# Patient Record
Sex: Female | Born: 1950 | Race: White | Hispanic: No | Smoking: Never smoker
Health system: Southern US, Community
[De-identification: ages and names within clinical notes are randomized; demographics above are authoritative.]

## PROBLEM LIST (undated history)

## (undated) DIAGNOSIS — E119 Type 2 diabetes mellitus without complications: Secondary | ICD-10-CM

## (undated) DIAGNOSIS — G709 Myoneural disorder, unspecified: Secondary | ICD-10-CM

## (undated) DIAGNOSIS — M069 Rheumatoid arthritis, unspecified: Secondary | ICD-10-CM

## (undated) DIAGNOSIS — I10 Essential (primary) hypertension: Secondary | ICD-10-CM

## (undated) DIAGNOSIS — T7840XA Allergy, unspecified, initial encounter: Secondary | ICD-10-CM

## (undated) DIAGNOSIS — K219 Gastro-esophageal reflux disease without esophagitis: Secondary | ICD-10-CM

## (undated) DIAGNOSIS — E079 Disorder of thyroid, unspecified: Secondary | ICD-10-CM

## (undated) HISTORY — DX: Gastro-esophageal reflux disease without esophagitis: K21.9

## (undated) HISTORY — DX: Allergy, unspecified, initial encounter: T78.40XA

## (undated) HISTORY — DX: Type 2 diabetes mellitus without complications: E11.9

## (undated) HISTORY — DX: Myoneural disorder, unspecified: G70.9

## (undated) HISTORY — DX: Essential (primary) hypertension: I10

## (undated) HISTORY — DX: Rheumatoid arthritis, unspecified: M06.9

## (undated) HISTORY — DX: Disorder of thyroid, unspecified: E07.9

## (undated) HISTORY — PX: CHOLECYSTECTOMY: SHX55

## (undated) HISTORY — PX: TUBAL LIGATION: SHX77

---

## 1978-03-14 HISTORY — PX: CHOLECYSTECTOMY, LAPAROSCOPIC: SHX56

## 1989-03-14 HISTORY — PX: ABDOMINAL HYSTERECTOMY: SHX81

## 2012-08-26 LAB — HM COLONOSCOPY

## 2019-07-22 LAB — CBC AND DIFFERENTIAL
HCT: 43 (ref 36–46)
Hemoglobin: 14.1 (ref 12.0–16.0)
WBC: 10.6

## 2019-07-22 LAB — CBC: RBC: 4.67 (ref 3.87–5.11)

## 2019-07-23 LAB — MICROALBUMIN, URINE: Microalb, Ur: 0.3

## 2020-01-23 LAB — LIPID PANEL
Cholesterol: 235 — AB (ref 0–200)
HDL: 46 (ref 35–70)
LDL Cholesterol: 147
LDl/HDL Ratio: 5.1
Triglycerides: 271 — AB (ref 40–160)

## 2020-01-23 LAB — BASIC METABOLIC PANEL
BUN: 16 (ref 4–21)
CO2: 26 — AB (ref 13–22)
Chloride: 104 (ref 99–108)
Creatinine: 1.1 (ref 0.5–1.1)
Glucose: 97
Potassium: 5 (ref 3.4–5.3)
Sodium: 139 (ref 137–147)

## 2020-01-23 LAB — COMPREHENSIVE METABOLIC PANEL
Albumin: 4.1 (ref 3.5–5.0)
Calcium: 9.7 (ref 8.7–10.7)
GFR calc Af Amer: 62
GFR calc non Af Amer: 53
Globulin: 3.2

## 2020-01-23 LAB — TSH: TSH: 2.63 (ref 0.41–5.90)

## 2020-01-23 LAB — CBC AND DIFFERENTIAL: Platelets: 390 (ref 150–399)

## 2020-01-23 LAB — HEPATIC FUNCTION PANEL
ALT: 15 (ref 7–35)
AST: 16 (ref 13–35)
Alkaline Phosphatase: 69 (ref 25–125)
Bilirubin, Total: 0.7

## 2020-01-23 LAB — HEMOGLOBIN A1C: Hemoglobin A1C: 6.4

## 2020-04-10 LAB — HM MAMMOGRAPHY

## 2020-09-23 ENCOUNTER — Telehealth: Payer: Self-pay

## 2020-09-23 DIAGNOSIS — E119 Type 2 diabetes mellitus without complications: Secondary | ICD-10-CM | POA: Insufficient documentation

## 2020-09-23 DIAGNOSIS — K219 Gastro-esophageal reflux disease without esophagitis: Secondary | ICD-10-CM | POA: Insufficient documentation

## 2020-09-23 DIAGNOSIS — M069 Rheumatoid arthritis, unspecified: Secondary | ICD-10-CM | POA: Insufficient documentation

## 2020-09-23 DIAGNOSIS — M170 Bilateral primary osteoarthritis of knee: Secondary | ICD-10-CM | POA: Insufficient documentation

## 2020-09-23 DIAGNOSIS — E118 Type 2 diabetes mellitus with unspecified complications: Secondary | ICD-10-CM | POA: Insufficient documentation

## 2020-09-23 DIAGNOSIS — I10 Essential (primary) hypertension: Secondary | ICD-10-CM | POA: Insufficient documentation

## 2020-09-23 NOTE — Telephone Encounter (Signed)
Precharted for patients new patient visit coming up with Dr Bari Edward on 09/30/20.

## 2020-09-30 ENCOUNTER — Other Ambulatory Visit: Payer: Self-pay | Admitting: Internal Medicine

## 2020-09-30 ENCOUNTER — Other Ambulatory Visit: Payer: Self-pay

## 2020-09-30 ENCOUNTER — Ambulatory Visit (INDEPENDENT_AMBULATORY_CARE_PROVIDER_SITE_OTHER): Payer: Medicare PPO | Admitting: Internal Medicine

## 2020-09-30 ENCOUNTER — Encounter: Payer: Self-pay | Admitting: Internal Medicine

## 2020-09-30 VITALS — BP 122/62 | HR 96 | Resp 14 | Ht 62.0 in | Wt 204.0 lb

## 2020-09-30 DIAGNOSIS — E1129 Type 2 diabetes mellitus with other diabetic kidney complication: Secondary | ICD-10-CM

## 2020-09-30 DIAGNOSIS — R809 Proteinuria, unspecified: Secondary | ICD-10-CM

## 2020-09-30 DIAGNOSIS — M170 Bilateral primary osteoarthritis of knee: Secondary | ICD-10-CM

## 2020-09-30 DIAGNOSIS — I10 Essential (primary) hypertension: Secondary | ICD-10-CM | POA: Diagnosis not present

## 2020-09-30 DIAGNOSIS — E039 Hypothyroidism, unspecified: Secondary | ICD-10-CM | POA: Insufficient documentation

## 2020-09-30 DIAGNOSIS — J3089 Other allergic rhinitis: Secondary | ICD-10-CM | POA: Diagnosis not present

## 2020-09-30 DIAGNOSIS — E790 Hyperuricemia without signs of inflammatory arthritis and tophaceous disease: Secondary | ICD-10-CM

## 2020-09-30 DIAGNOSIS — K219 Gastro-esophageal reflux disease without esophagitis: Secondary | ICD-10-CM

## 2020-09-30 DIAGNOSIS — E118 Type 2 diabetes mellitus with unspecified complications: Secondary | ICD-10-CM | POA: Diagnosis not present

## 2020-09-30 LAB — POCT UA - MICROALBUMIN: Microalbumin Ur, POC: 20 mg/L

## 2020-09-30 MED ORDER — CELECOXIB 200 MG PO CAPS: 200.0000 mg | ORAL_CAPSULE | Freq: Every day | ORAL | 1 refills | Status: DC

## 2020-09-30 MED ORDER — METFORMIN HCL 500 MG PO TABS: 500.0000 mg | ORAL_TABLET | Freq: Two times a day (BID) | ORAL | 1 refills | Status: DC

## 2020-09-30 MED ORDER — LEVOTHYROXINE SODIUM 50 MCG PO TABS: 50.0000 ug | ORAL_TABLET | Freq: Every day | ORAL | 1 refills | Status: DC

## 2020-09-30 MED ORDER — OMEPRAZOLE 40 MG PO CPDR: 40.0000 mg | DELAYED_RELEASE_CAPSULE | Freq: Every day | ORAL | 1 refills | Status: DC

## 2020-09-30 MED ORDER — LISINOPRIL 10 MG PO TABS: 10.0000 mg | ORAL_TABLET | Freq: Every day | ORAL | 1 refills | Status: DC

## 2020-09-30 MED ORDER — ALLOPURINOL 100 MG PO TABS: 100.0000 mg | ORAL_TABLET | Freq: Every day | ORAL | 1 refills | Status: DC

## 2020-09-30 NOTE — Progress Notes (Signed)
Date:  09/30/2020   Name:  Alexandria Franklin   DOB:  12-23-1950   MRN:  [id number]   Chief Complaint: Establish Care, Gastroesophageal Reflux (History of abnormal EGD/Colonoscopy around 2015; unsure if hernia or aneurism; taking omeprazole 40 mg daily), and Sinus Problem (Right-sided facial temporal and eye pressure; taking Afrin as needed with some relief)  Gastroesophageal Reflux She complains of belching, coughing, heartburn and a hoarse voice. She reports no abdominal pain or no chest pain. This is a recurrent problem. The problem occurs occasionally. Pertinent negatives include no fatigue. She has tried a PPI for the symptoms.  Sinus Problem This is a recurrent problem. The problem has been waxing and waning since onset. There has been no fever. The pain is mild. Associated symptoms include coughing, ear pain, a hoarse voice and sinus pressure. Pertinent negatives include no headaches or shortness of breath. Treatments tried: Afrin and Flonase. The treatment provided no relief.  Diabetes She presents for her follow-up diabetic visit. She has type 2 diabetes mellitus. The initial diagnosis of diabetes was made 8 years ago. Her disease course has been stable. Pertinent negatives for hypoglycemia include no headaches, nervousness/anxiousness or tremors. Pertinent negatives for diabetes include no chest pain, no fatigue, no polydipsia and no polyuria. Current diabetic treatment includes oral agent (monotherapy). She is compliant with treatment all of the time. Her weight is stable. She is following a generally healthy diet. Blood glucose monitoring compliance is poor. Her breakfast blood glucose is taken between 6-7 am. Her breakfast blood glucose range is generally 110-130 mg/dl. An ACE inhibitor/angiotensin II receptor blocker is being taken.  Hypertension This is a chronic problem. The problem is controlled. Pertinent negatives include no chest pain, headaches, palpitations or shortness of breath.  Risk factors for coronary artery disease include dyslipidemia and diabetes mellitus. Past treatments include ACE inhibitors.   No results found for: CREATININE, BUN, NA, K, CL, CO2 No results found for: CHOL, HDL, LDLCALC, LDLDIRECT, TRIG, CHOLHDL No results found for: TSH No results found for: HGBA1C No results found for: WBC, HGB, HCT, MCV, PLT No results found for: ALT, AST, GGT, ALKPHOS, BILITOT   Review of Systems  Constitutional:  Negative for appetite change, fatigue, fever and unexpected weight change.  HENT:  Positive for ear pain, hoarse voice and sinus pressure. Negative for tinnitus and trouble swallowing.   Eyes:  Negative for visual disturbance.  Respiratory:  Positive for cough. Negative for chest tightness and shortness of breath.   Cardiovascular:  Negative for chest pain, palpitations and leg swelling.  Gastrointestinal:  Positive for heartburn. Negative for abdominal pain.  Endocrine: Negative for polydipsia and polyuria.  Genitourinary:  Negative for dysuria and hematuria.  Musculoskeletal:  Positive for arthralgias (knees).  Skin:  Negative for color change and rash.  Neurological:  Negative for tremors, numbness and headaches.  Psychiatric/Behavioral:  Negative for dysphoric mood and sleep disturbance. The patient is not nervous/anxious.    Patient Active Problem List   Diagnosis Date Noted   Acquired hypothyroidism 09/30/2020   Type II diabetes mellitus with complication (HCC) 09/23/2020   Essential hypertension 09/23/2020   GERD (gastroesophageal reflux disease) 09/23/2020   Rheumatoid arthritis (HCC) 09/23/2020    Allergies  Allergen Reactions   Codeine Nausea Only    Past Surgical History:  Procedure Laterality Date   ABDOMINAL HYSTERECTOMY  1991   CHOLECYSTECTOMY, LAPAROSCOPIC  1980    Social History   Tobacco Use   Smoking status: Never  Passive exposure: Never   Smokeless tobacco: Never  Vaping Use   Vaping Use: Never used  Substance  Use Topics   Alcohol use: Not Currently   Drug use: Never     Medication list has been reviewed and updated.  Current Meds  Medication Sig   allopurinol (ZYLOPRIM) 100 MG tablet Take 100 mg by mouth daily.   celecoxib (CELEBREX) 200 MG capsule Take 200 mg by mouth daily.   levothyroxine (SYNTHROID) 50 MCG tablet Take 50 mcg by mouth daily before breakfast.   lisinopril (ZESTRIL) 10 MG tablet Take 10 mg by mouth daily.   metFORMIN (GLUCOPHAGE) 500 MG tablet Take 500 mg by mouth 2 (two) times daily with a meal.   Multiple Vitamin (MULTIVITAMIN ADULT PO) Take 1 capsule by mouth daily.   omeprazole (PRILOSEC) 40 MG capsule Take 40 mg by mouth daily.   oxymetazoline (AFRIN) 0.05 % nasal spray Place 1 spray into both nostrils 2 (two) times daily as needed for congestion.    PHQ 2/9 Scores 09/30/2020  PHQ - 2 Score 0  PHQ- 9 Score 4    GAD 7 : Generalized Anxiety Score 09/30/2020  Nervous, Anxious, on Edge 0  Control/stop worrying 0  Worry too much - different things 0  Trouble relaxing 0  Restless 0  Easily annoyed or irritable 1  Afraid - awful might happen 0  Total GAD 7 Score 1  Anxiety Difficulty Not difficult at all    BP Readings from Last 3 Encounters:  09/30/20 122/62    Physical Exam Vitals and nursing note reviewed.  Constitutional:      General: She is not in acute distress.    Appearance: Normal appearance. She is well-developed.  HENT:     Head: Normocephalic and atraumatic.  Neck:     Thyroid: No thyroid mass, thyromegaly or thyroid tenderness.     Vascular: No carotid bruit.  Cardiovascular:     Rate and Rhythm: Normal rate and regular rhythm.     Pulses: Normal pulses.     Heart sounds: No murmur heard. Pulmonary:     Effort: Pulmonary effort is normal. No respiratory distress.     Breath sounds: No wheezing or rhonchi.  Musculoskeletal:     Cervical back: Normal range of motion.     Right lower leg: No edema.     Left lower leg: No edema.   Lymphadenopathy:     Cervical: No cervical adenopathy.  Skin:    General: Skin is warm and dry.     Findings: No rash.  Neurological:     Mental Status: She is alert and oriented to person, place, and time.  Psychiatric:        Mood and Affect: Mood normal.        Behavior: Behavior normal.    Wt Readings from Last 3 Encounters:  09/30/20 204 lb (92.5 kg)    BP 122/62 (BP Location: Right Arm, Patient Position: Sitting, Cuff Size: Large)   Pulse 96   Resp 14   Ht 5\' 2"  (1.575 m)   Wt 204 lb (92.5 kg)   SpO2 98%   BMI 37.31 kg/m   Assessment and Plan: 1. Gastroesophageal reflux disease, unspecified whether esophagitis present Symptoms well controlled on daily PPI. Hx of EGD with ? Findings HH per patient - need records to exclude Barrett's No red flag signs such as weight loss, n/v, melena Will continue omeprazole. - CBC with Differential/Platelet - omeprazole (PRILOSEC) 40 MG capsule; Take  1 capsule (40 mg total) by mouth daily.  Dispense: 90 capsule; Refill: 1  2. Essential hypertension Clinically stable exam with well controlled BP. Tolerating medications without side effects at this time. Pt to continue current regimen and low sodium diet; benefits of regular exercise as able discussed. - Comprehensive metabolic panel - lisinopril (ZESTRIL) 10 MG tablet; Take 1 tablet (10 mg total) by mouth daily.  Dispense: 90 tablet; Refill: 1  3. Environmental and seasonal allergies Continue Afrin - recommend trying to wean off Begin Allegra or Claritin daily May need to see ENT  4. Type II diabetes mellitus with complication (HCC) Clinically stable by exam and report without s/s of hypoglycemia. DM complicated by hypertension and dyslipidemia. Tolerating medications well without side effects or other concerns. - Hemoglobin A1c - Lipid panel - POCT UA - Microalbumin +  Needs to continue ACEI - metFORMIN (GLUCOPHAGE) 500 MG tablet; Take 1 tablet (500 mg total) by mouth 2  (two) times daily with a meal.  Dispense: 180 tablet; Refill: 1  5. Acquired hypothyroidism supplemented - levothyroxine (SYNTHROID) 50 MCG tablet; Take 1 tablet (50 mcg total) by mouth daily before breakfast.  Dispense: 90 tablet; Refill: 1 - TSH + free T4  6. Primary osteoarthritis of both knees Continue celebrex - celecoxib (CELEBREX) 200 MG capsule; Take 1 capsule (200 mg total) by mouth daily.  Dispense: 90 capsule; Refill: 1  7. Asymptomatic hyperuricemia On allopurinol with no hx of gouty arthrtis - allopurinol (ZYLOPRIM) 100 MG tablet; Take 1 tablet (100 mg total) by mouth daily.  Dispense: 90 tablet; Refill: 1 - Uric acid   Partially dictated using Animal nutritionist. Any errors are unintentional.  Bari Edward, MD [facility]  09/30/2020

## 2020-09-30 NOTE — Patient Instructions (Signed)
Take Claritin 10 mg or Allegra 180 mg daily for post nasal drip and cough

## 2020-10-01 LAB — CBC WITH DIFFERENTIAL/PLATELET
Basophils Absolute: 0.1 10*3/uL (ref 0.0–0.2)
Basos: 1 %
EOS (ABSOLUTE): 0.4 10*3/uL (ref 0.0–0.4)
Eos: 3 %
Hematocrit: 39.8 % (ref 34.0–46.6)
Hemoglobin: 13.4 g/dL (ref 11.1–15.9)
Immature Grans (Abs): 0 10*3/uL (ref 0.0–0.1)
Immature Granulocytes: 0 %
Lymphocytes Absolute: 4 10*3/uL — ABNORMAL HIGH (ref 0.7–3.1)
Lymphs: 30 %
MCH: 30 pg (ref 26.6–33.0)
MCHC: 33.7 g/dL (ref 31.5–35.7)
MCV: 89 fL (ref 79–97)
Monocytes Absolute: 0.8 10*3/uL (ref 0.1–0.9)
Monocytes: 6 %
Neutrophils Absolute: 8.1 10*3/uL — ABNORMAL HIGH (ref 1.4–7.0)
Neutrophils: 60 %
Platelets: 428 10*3/uL (ref 150–450)
RBC: 4.46 x10E6/uL (ref 3.77–5.28)
RDW: 13.9 % (ref 11.7–15.4)
WBC: 13.4 10*3/uL — ABNORMAL HIGH (ref 3.4–10.8)

## 2020-10-01 LAB — COMPREHENSIVE METABOLIC PANEL
ALT: 10 IU/L (ref 0–32)
AST: 12 IU/L (ref 0–40)
Albumin/Globulin Ratio: 1.9 (ref 1.2–2.2)
Albumin: 4.4 g/dL (ref 3.7–4.7)
Alkaline Phosphatase: 71 IU/L (ref 44–121)
BUN/Creatinine Ratio: 17 (ref 12–28)
BUN: 18 mg/dL (ref 8–27)
Bilirubin Total: 0.3 mg/dL (ref 0.0–1.2)
CO2: 20 mmol/L (ref 20–29)
Calcium: 9.8 mg/dL (ref 8.7–10.3)
Chloride: 107 mmol/L — ABNORMAL HIGH (ref 96–106)
Creatinine, Ser: 1.04 mg/dL — ABNORMAL HIGH (ref 0.57–1.00)
Globulin, Total: 2.3 g/dL (ref 1.5–4.5)
Glucose: 116 mg/dL — ABNORMAL HIGH (ref 65–99)
Potassium: 4.8 mmol/L (ref 3.5–5.2)
Sodium: 142 mmol/L (ref 134–144)
Total Protein: 6.7 g/dL (ref 6.0–8.5)
eGFR: 57 mL/min/{1.73_m2} — ABNORMAL LOW (ref >59)

## 2020-10-01 LAB — LIPID PANEL
Chol/HDL Ratio: 5.4 ratio — ABNORMAL HIGH (ref 0.0–4.4)
Cholesterol, Total: 260 mg/dL — ABNORMAL HIGH (ref 100–199)
HDL: 48 mg/dL (ref >39)
LDL Chol Calc (NIH): 168 mg/dL — ABNORMAL HIGH (ref 0–99)
Triglycerides: 237 mg/dL — ABNORMAL HIGH (ref 0–149)
VLDL Cholesterol Cal: 44 mg/dL — ABNORMAL HIGH (ref 5–40)

## 2020-10-01 LAB — URIC ACID: Uric Acid: 5.5 mg/dL (ref 3.1–7.9)

## 2020-10-01 LAB — HEMOGLOBIN A1C
Est. average glucose Bld gHb Est-mCnc: 134 mg/dL
Hgb A1c MFr Bld: 6.3 % — ABNORMAL HIGH (ref 4.8–5.6)

## 2020-10-01 LAB — TSH+FREE T4
Free T4: 0.98 ng/dL (ref 0.82–1.77)
TSH: 2.17 u[IU]/mL (ref 0.450–4.500)

## 2020-10-06 NOTE — Progress Notes (Signed)
Called pt left VM as a reminder for pt to get her labs done.  KP

## 2020-10-07 ENCOUNTER — Encounter: Payer: Self-pay | Admitting: Internal Medicine

## 2020-10-07 DIAGNOSIS — E785 Hyperlipidemia, unspecified: Secondary | ICD-10-CM | POA: Insufficient documentation

## 2020-10-07 DIAGNOSIS — E1169 Type 2 diabetes mellitus with other specified complication: Secondary | ICD-10-CM | POA: Insufficient documentation

## 2020-10-08 ENCOUNTER — Other Ambulatory Visit: Payer: Self-pay | Admitting: Internal Medicine

## 2020-10-08 DIAGNOSIS — E1169 Type 2 diabetes mellitus with other specified complication: Secondary | ICD-10-CM

## 2020-10-08 MED ORDER — ATORVASTATIN CALCIUM 10 MG PO TABS: 10.0000 mg | ORAL_TABLET | Freq: Every day | ORAL | 1 refills | Status: DC

## 2020-10-08 NOTE — Progress Notes (Signed)
Informed pt with labs. Pt [facility]. Pt verbalized understanding.  KP

## 2021-02-08 ENCOUNTER — Encounter: Payer: Self-pay | Admitting: Internal Medicine

## 2021-02-08 ENCOUNTER — Other Ambulatory Visit: Payer: Self-pay

## 2021-02-08 ENCOUNTER — Other Ambulatory Visit: Payer: Self-pay | Admitting: Internal Medicine

## 2021-02-08 ENCOUNTER — Ambulatory Visit (INDEPENDENT_AMBULATORY_CARE_PROVIDER_SITE_OTHER): Payer: Medicare PPO | Admitting: Internal Medicine

## 2021-02-08 VITALS — BP 136/78 | HR 64 | Ht 62.0 in | Wt 211.8 lb

## 2021-02-08 DIAGNOSIS — E1129 Type 2 diabetes mellitus with other diabetic kidney complication: Secondary | ICD-10-CM | POA: Diagnosis not present

## 2021-02-08 DIAGNOSIS — Z1159 Encounter for screening for other viral diseases: Secondary | ICD-10-CM

## 2021-02-08 DIAGNOSIS — R809 Proteinuria, unspecified: Secondary | ICD-10-CM | POA: Diagnosis not present

## 2021-02-08 DIAGNOSIS — E118 Type 2 diabetes mellitus with unspecified complications: Secondary | ICD-10-CM

## 2021-02-08 DIAGNOSIS — E1169 Type 2 diabetes mellitus with other specified complication: Secondary | ICD-10-CM | POA: Diagnosis not present

## 2021-02-08 DIAGNOSIS — Z1231 Encounter for screening mammogram for malignant neoplasm of breast: Secondary | ICD-10-CM

## 2021-02-08 DIAGNOSIS — E785 Hyperlipidemia, unspecified: Secondary | ICD-10-CM

## 2021-02-08 NOTE — Progress Notes (Signed)
Date:  02/08/2021   Name:  Alexandria Franklin   DOB:  [birth year]   MRN:  [id number]   Chief Complaint: Hypertension and Diabetes  Diabetes She presents for her follow-up diabetic visit. She has type 2 diabetes mellitus. Her disease course has been stable. Pertinent negatives for hypoglycemia include no headaches or tremors. Pertinent negatives for diabetes include no chest pain, no fatigue, no polydipsia and no polyuria. Symptoms are stable. Current diabetic treatment includes oral agent (monotherapy) (metformin). She is compliant with treatment all of the time. She is following a generally healthy diet. Her breakfast blood glucose is taken between 6-7 am. Her breakfast blood glucose range is generally 90-110 mg/dl. An ACE inhibitor/angiotensin II receptor blocker is being taken. Eye exam is current.  Hyperlipidemia This is a chronic problem. The problem is uncontrolled. Pertinent negatives include no chest pain or shortness of breath. Current antihyperlipidemic treatment includes statins (started last visit). There are no compliance problems.   Hypertension This is a chronic problem. The problem is controlled. Pertinent negatives include no chest pain, headaches, palpitations or shortness of breath. Past treatments include ACE inhibitors.  Gastroesophageal Reflux She complains of abdominal pain (intermittent LUQ), coughing and heartburn. She reports no chest pain. This is a recurrent problem. The problem occurs occasionally. Pertinent negatives include no fatigue. Risk factors include hiatal hernia. She has tried a PPI for the symptoms. Past procedures include an EGD.   Lab Results  Component Value Date   NA 142 09/30/2020   K 4.8 09/30/2020   CO2 20 09/30/2020   GLUCOSE 116 (H) 09/30/2020   BUN 18 09/30/2020   CREATININE 1.04 (H) 09/30/2020   CALCIUM 9.8 09/30/2020   EGFR 57 (L) 09/30/2020   GFRNONAA 53 01/23/2020   Lab Results  Component Value Date   CHOL 260 (H) 09/30/2020   HDL  48 09/30/2020   LDLCALC 168 (H) 09/30/2020   TRIG 237 (H) 09/30/2020   CHOLHDL 5.4 (H) 09/30/2020   Lab Results  Component Value Date   TSH 2.170 09/30/2020   Lab Results  Component Value Date   HGBA1C 6.3 (H) 09/30/2020   Lab Results  Component Value Date   WBC 13.4 (H) 09/30/2020   HGB 13.4 09/30/2020   HCT 39.8 09/30/2020   MCV 89 09/30/2020   PLT 428 09/30/2020   Lab Results  Component Value Date   ALT 10 09/30/2020   AST 12 09/30/2020   ALKPHOS 71 09/30/2020   BILITOT 0.3 09/30/2020   No components found for: VITD  Review of Systems  Constitutional:  Negative for appetite change, chills, fatigue, fever and unexpected weight change.  HENT:  Negative for tinnitus and trouble swallowing.   Eyes:  Negative for visual disturbance.  Respiratory:  Positive for cough. Negative for chest tightness and shortness of breath.   Cardiovascular:  Negative for chest pain, palpitations and leg swelling.  Gastrointestinal:  Positive for abdominal pain (intermittent LUQ) and heartburn.  Endocrine: Negative for polydipsia and polyuria.  Genitourinary:  Negative for dysuria and hematuria.  Musculoskeletal:  Negative for arthralgias.  Neurological:  Negative for tremors, numbness and headaches.  Psychiatric/Behavioral:  Negative for dysphoric mood.    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[place]) 09/30/2020  Type II diabetes mellitus with complication (HCC) 84/66/5993   Essential hypertension 09/23/2020   GERD (gastroesophageal reflux disease) 09/23/2020   Osteoarthritis of both knees 09/23/2020    Allergies  Allergen Reactions   Codeine Nausea Only    Past Surgical History:  Procedure  Laterality Date   ABDOMINAL HYSTERECTOMY  1991   CHOLECYSTECTOMY, LAPAROSCOPIC  1980    Social History   Tobacco Use   Smoking status: Never    Passive exposure: Never   Smokeless tobacco: Never  Vaping Use   Vaping Use: Never used  Substance Use Topics   Alcohol use: Not Currently   Drug use: Never     Medication list has been reviewed and updated.  Current Meds  Medication Sig   allopurinol (ZYLOPRIM) 100 MG tablet Take 1 tablet (100 mg total) by mouth daily.   atorvastatin (LIPITOR) 10 MG tablet Take 1 tablet (10 mg total) by mouth daily.   celecoxib (CELEBREX) 200 MG capsule Take 1 capsule (200 mg total) by mouth daily.   levothyroxine (SYNTHROID) 50 MCG tablet Take 1 tablet (50 mcg total) by mouth daily before breakfast.   lisinopril (ZESTRIL) 10 MG tablet Take 1 tablet (10 mg total) by mouth daily.   metFORMIN (GLUCOPHAGE) 500 MG tablet Take 1 tablet (500 mg total) by mouth 2 (two) times daily with a meal.   Multiple Vitamin (MULTIVITAMIN ADULT PO) Take 1 capsule by mouth daily.   omeprazole (PRILOSEC) 40 MG capsule Take 1 capsule (40 mg total) by mouth daily.   oxymetazoline (AFRIN) 0.05 % nasal spray Place 1 spray into both nostrils 2 (two) times daily as needed for congestion.    PHQ 2/9 Scores 02/08/2021 09/30/2020  PHQ - 2 Score 0 0  PHQ- 9 Score 2 4    GAD 7 : Generalized Anxiety Score 02/08/2021 09/30/2020  Nervous, Anxious, on Edge 0 0  Control/stop worrying 0 0  Worry too much - different things 0 0  Trouble relaxing 0 0  Restless 0 0  Easily annoyed or irritable 1 1  Afraid - awful might happen 0 0  Total GAD 7 Score 1 1  Anxiety Difficulty Not difficult at all Not difficult at all    BP Readings from Last 3 Encounters:  02/08/21 136/78  09/30/20 122/62    Physical Exam Vitals and nursing note reviewed.  Constitutional:      General: She is not in acute distress.    Appearance: She is well-developed.  HENT:     Head: Normocephalic and  atraumatic.  Cardiovascular:     Rate and Rhythm: Normal rate and regular rhythm.     Pulses: Normal pulses.  Pulmonary:     Effort: Pulmonary effort is normal. No respiratory distress.     Breath sounds: No wheezing or rhonchi.  Abdominal:     General: Bowel sounds are normal.     Palpations: Abdomen is soft. There is no mass.     Tenderness: There is no abdominal tenderness. There is no guarding.  Musculoskeletal:     Cervical back: Normal range of motion.     Right lower leg: No edema.     Left lower leg: No edema.  Skin:    General: Skin is warm and dry.     Findings: No rash.  Neurological:     Mental Status: She is alert and oriented to person, place, and time.  Psychiatric:        Mood and Affect: Mood normal.  Behavior: Behavior normal.    Wt Readings from Last 3 Encounters:  02/08/21 211 lb 12.8 oz (96.1 kg)  09/30/20 204 lb (92.5 kg)    BP 136/78   Pulse 64   Ht $R'5\' 2"'su$  (1.575 m)   Wt 211 lb 12.8 oz (96.1 kg)   SpO2 96%   BMI 38.74 kg/m   Assessment and Plan: 1. Type II diabetes mellitus with complication (HCC) Clinically stable by exam and report without s/s of hypoglycemia. DM complicated by hypertension and dyslipidemia. Tolerating medications well without side effects or other concerns.  2. Hyperlipidemia associated with type 2 diabetes mellitus ([place]) Now on statin medication without side effects - Lipid panel  3. Need for hepatitis C screening test - Hepatitis C antibody  4. Diabetes mellitus with microalbuminuria (HCC) On ACEI - Comprehensive metabolic panel - Hemoglobin A1c  5. Encounter for screening mammogram for breast cancer Schedule at [facility] - MM 3D SCREEN BREAST BILATERAL   Partially dictated using Editor, commissioning. Any errors are unintentional.  Halina Maidens, MD [facility]  02/08/2021

## 2021-02-09 LAB — LIPID PANEL
Chol/HDL Ratio: 3.7 ratio (ref 0.0–4.4)
Cholesterol, Total: 161 mg/dL (ref 100–199)
HDL: 43 mg/dL (ref >39)
LDL Chol Calc (NIH): 92 mg/dL (ref 0–99)
Triglycerides: 148 mg/dL (ref 0–149)
VLDL Cholesterol Cal: 26 mg/dL (ref 5–40)

## 2021-02-09 LAB — COMPREHENSIVE METABOLIC PANEL
ALT: 10 IU/L (ref 0–32)
AST: 12 IU/L (ref 0–40)
Albumin/Globulin Ratio: 1.9 (ref 1.2–2.2)
Albumin: 4.1 g/dL (ref 3.7–4.7)
Alkaline Phosphatase: 81 IU/L (ref 44–121)
BUN/Creatinine Ratio: 12 (ref 12–28)
BUN: 13 mg/dL (ref 8–27)
Bilirubin Total: 0.6 mg/dL (ref 0.0–1.2)
CO2: 20 mmol/L (ref 20–29)
Calcium: 9.5 mg/dL (ref 8.7–10.3)
Chloride: 108 mmol/L — ABNORMAL HIGH (ref 96–106)
Creatinine, Ser: 1.08 mg/dL — ABNORMAL HIGH (ref 0.57–1.00)
Globulin, Total: 2.2 g/dL (ref 1.5–4.5)
Glucose: 118 mg/dL — ABNORMAL HIGH (ref 70–99)
Potassium: 5 mmol/L (ref 3.5–5.2)
Sodium: 144 mmol/L (ref 134–144)
Total Protein: 6.3 g/dL (ref 6.0–8.5)
eGFR: 55 mL/min/{1.73_m2} — ABNORMAL LOW (ref >59)

## 2021-02-09 LAB — HEPATITIS C ANTIBODY: Hep C Virus Ab: <0.1 s/co ratio (ref 0.0–0.9)

## 2021-02-09 LAB — HEMOGLOBIN A1C
Est. average glucose Bld gHb Est-mCnc: 140 mg/dL
Hgb A1c MFr Bld: 6.5 % — ABNORMAL HIGH (ref 4.8–5.6)

## 2021-02-25 ENCOUNTER — Telehealth: Payer: Self-pay

## 2021-02-25 NOTE — Telephone Encounter (Signed)
Called pt left VM as a reminder to call and schedule mammogram. ° °[facility] nurse may give results to patient if they return call to clinic, a CRM has been created. ° °KP °

## 2021-02-26 NOTE — Telephone Encounter (Signed)
Attempted to call patient to verify she got reminder message. Left message to call office.

## 2021-02-26 NOTE — Telephone Encounter (Signed)
Patient scheduled mammogram for 04/06/2021

## 2021-03-01 NOTE — Telephone Encounter (Signed)
Noted  KP 

## 2021-03-04 ENCOUNTER — Other Ambulatory Visit: Payer: Self-pay | Admitting: Internal Medicine

## 2021-03-04 DIAGNOSIS — K219 Gastro-esophageal reflux disease without esophagitis: Secondary | ICD-10-CM

## 2021-03-17 ENCOUNTER — Other Ambulatory Visit: Payer: Self-pay | Admitting: Internal Medicine

## 2021-03-17 DIAGNOSIS — E790 Hyperuricemia without signs of inflammatory arthritis and tophaceous disease: Secondary | ICD-10-CM

## 2021-03-17 DIAGNOSIS — I10 Essential (primary) hypertension: Secondary | ICD-10-CM

## 2021-03-17 DIAGNOSIS — E039 Hypothyroidism, unspecified: Secondary | ICD-10-CM

## 2021-03-17 DIAGNOSIS — E118 Type 2 diabetes mellitus with unspecified complications: Secondary | ICD-10-CM

## 2021-03-17 DIAGNOSIS — M170 Bilateral primary osteoarthritis of knee: Secondary | ICD-10-CM

## 2021-03-18 NOTE — Telephone Encounter (Signed)
Requested Prescriptions  Pending Prescriptions Disp Refills   allopurinol (ZYLOPRIM) 100 MG tablet [Pharmacy Med Name: ALLOPURINOL TABS $RemoveBeforeDE'100MG'[id number]$ ] 90 tablet 3    Sig: TAKE 1 TABLET DAILY     Endocrinology:  Gout Agents Failed - 03/17/2021  1:55 PM      Failed - Cr in normal range and within 360 days    Creatinine, Ser  Date Value Ref Range Status  02/08/2021 1.08 (H) 0.57 - 1.00 mg/dL Final         Passed - Uric Acid in normal range and within 360 days    Uric Acid  Date Value Ref Range Status  09/30/2020 5.5 3.1 - 7.9 mg/dL Final    Comment:               Therapeutic target for gout patients: <6.0         Passed - Valid encounter within last 12 months    Recent Outpatient Visits          1 month ago Type II diabetes mellitus with complication [facility])   [facility] Glean Hess, MD   5 months ago Gastroesophageal reflux disease, unspecified whether esophagitis present   [facility] Glean Hess, MD      Future Appointments            In 2 months Glean Hess, MD [facility], [facility]            lisinopril (ZESTRIL) 10 MG tablet [Pharmacy Med Name: LISINOPRIL TABS $RemoveBeforeD'10MG'[id number]$ ] 90 tablet 3    Sig: TAKE 1 TABLET DAILY     Cardiovascular:  ACE Inhibitors Failed - 03/17/2021  1:55 PM      Failed - Cr in normal range and within 180 days    Creatinine, Ser  Date Value Ref Range Status  02/08/2021 1.08 (H) 0.57 - 1.00 mg/dL Final         Passed - K in normal range and within 180 days    Potassium  Date Value Ref Range Status  02/08/2021 5.0 3.5 - 5.2 mmol/L Final         Passed - Patient is not pregnant      Passed - Last BP in normal range    BP Readings from Last 1 Encounters:  02/08/21 136/78         Passed - Valid encounter within last 6 months    Recent Outpatient Visits          1 month ago Type II diabetes mellitus with complication [facility])   [facility] Glean Hess, MD   5 months ago Gastroesophageal reflux  disease, unspecified whether esophagitis present   [facility] Glean Hess, MD      Future Appointments            In 2 months Army Melia Jesse Sans, MD [facility], [facility]            SYNTHROID 50 MCG tablet [Pharmacy Med Name: SYNTHROID TABS 50MCG] 90 tablet 3    Sig: TAKE 1 TABLET DAILY BEFORE BREAKFAST     Endocrinology:  Hypothyroid Agents Failed - 03/17/2021  1:55 PM      Failed - TSH needs to be rechecked within 3 months after an abnormal result. Refill until TSH is due.      Passed - TSH in normal range and within 360 days    TSH  Date Value Ref Range Status  09/30/2020 2.170 0.450 -  4.500 uIU/mL Final         Passed - Valid encounter within last 12 months    Recent Outpatient Visits          1 month ago Type II diabetes mellitus with complication [facility])   [facility] Glean Hess, MD   5 months ago Gastroesophageal reflux disease, unspecified whether esophagitis present   [facility] Glean Hess, MD      Future Appointments            In 2 months Glean Hess, MD [facility], [facility]            celecoxib (CELEBREX) 200 MG capsule [Pharmacy Med Name: CELECOXIB CAPS $RemoveBefore'200MG'[id number]$ ] 90 capsule 3    Sig: TAKE 1 CAPSULE DAILY     Analgesics:  COX2 Inhibitors Failed - 03/17/2021  1:55 PM      Failed - Cr in normal range and within 360 days    Creatinine, Ser  Date Value Ref Range Status  02/08/2021 1.08 (H) 0.57 - 1.00 mg/dL Final         Passed - HGB in normal range and within 360 days    Hemoglobin  Date Value Ref Range Status  09/30/2020 13.4 11.1 - 15.9 g/dL Final         Passed - Patient is not pregnant      Passed - Valid encounter within last 12 months    Recent Outpatient Visits          1 month ago Type II diabetes mellitus with complication [facility])   [facility] Glean Hess, MD   5 months ago Gastroesophageal reflux disease, unspecified whether esophagitis present   [facility] Glean Hess, MD      Future Appointments            In 2 months Army Melia Jesse Sans, MD [facility] [place]            metFORMIN (GLUCOPHAGE) 500 MG tablet [Pharmacy Med Name: METFORMIN HCL TABS $Remove'500MG'[id number]$ ] 180 tablet 3    Sig: TAKE 1 TABLET TWICE A DAY WITH MEALS     Endocrinology:  Diabetes - Biguanides Failed - 03/17/2021  1:55 PM      Failed - Cr in normal range and within 360 days    Creatinine, Ser  Date Value Ref Range Status  02/08/2021 1.08 (H) 0.57 - 1.00 mg/dL Final         Failed - eGFR in normal range and within 360 days    GFR calc Af Amer  Date Value Ref Range Status  01/23/2020 62  Final   GFR calc non Af Amer  Date Value Ref Range Status  01/23/2020 53  Final   eGFR  Date Value Ref Range Status  02/08/2021 55 (L) >59 mL/min/1.73 Final         Passed - HBA1C is between 0 and 7.9 and within 180 days    Hemoglobin A1C  Date Value Ref Range Status  01/23/2020 6.4  Final   Hgb A1c MFr Bld  Date Value Ref Range Status  02/08/2021 6.5 (H) 4.8 - 5.6 % Final    Comment:             Prediabetes: 5.7 - 6.4          Diabetes: >6.4          Glycemic control for adults with diabetes: <7.0  Passed - Valid encounter within last 6 months    Recent Outpatient Visits          1 month ago Type II diabetes mellitus with complication [facility])   [facility] Glean Hess, MD   5 months ago Gastroesophageal reflux disease, unspecified whether esophagitis present   [facility] Glean Hess, MD      Future Appointments            In 2 months Army Melia Jesse Sans, MD [facility], [place] [facility]

## 2021-03-19 ENCOUNTER — Other Ambulatory Visit: Payer: Self-pay | Admitting: Internal Medicine

## 2021-03-19 DIAGNOSIS — E785 Hyperlipidemia, unspecified: Secondary | ICD-10-CM

## 2021-03-19 DIAGNOSIS — E1169 Type 2 diabetes mellitus with other specified complication: Secondary | ICD-10-CM

## 2021-03-19 NOTE — Telephone Encounter (Signed)
Requested Prescriptions  Pending Prescriptions Disp Refills   atorvastatin (LIPITOR) 10 MG tablet [Pharmacy Med Name: ATORVASTATIN TABS 10MG ] 90 tablet 1    Sig: TAKE 1 TABLET DAILY     Cardiovascular:  Antilipid - Statins Passed - 03/19/2021  2:42 AM      Passed - Total Cholesterol in normal range and within 360 days    Cholesterol, Total  Date Value Ref Range Status  02/08/2021 161 100 - 199 mg/dL Final         Passed - LDL in normal range and within 360 days    LDL Chol Calc (NIH)  Date Value Ref Range Status  02/08/2021 92 0 - 99 mg/dL Final         Passed - HDL in normal range and within 360 days    HDL  Date Value Ref Range Status  02/08/2021 43 >39 mg/dL Final         Passed - Triglycerides in normal range and within 360 days    Triglycerides  Date Value Ref Range Status  02/08/2021 148 0 - 149 mg/dL Final         Passed - Patient is not pregnant      Passed - Valid encounter within last 12 months    Recent Outpatient Visits          1 month ago Type II diabetes mellitus with complication [facility](HCC)   [facility] Reubin MilanBerglund, Laura H, MD   5 months ago Gastroesophageal reflux disease, unspecified whether esophagitis present   [facility] H, [facility]      Future Appointments            In 2 months Judithann GravesBerglund, Nyoka CowdenLaura H, [facility]

## 2021-04-06 ENCOUNTER — Other Ambulatory Visit: Payer: Self-pay

## 2021-04-06 ENCOUNTER — Ambulatory Visit
Admission: RE | Admit: 2021-04-06 | Discharge: 2021-04-06 | Disposition: A | Discharge status: Home / Self Care | Payer: Medicare PPO | Source: Ambulatory Visit | Attending: Internal Medicine | Admitting: Internal Medicine

## 2021-04-06 DIAGNOSIS — Z1231 Encounter for screening mammogram for malignant neoplasm of breast: Secondary | ICD-10-CM | POA: Diagnosis not present

## 2021-04-19 ENCOUNTER — Inpatient Hospital Stay
Admission: RE | Admit: 2021-04-19 | Discharge: 2021-04-19 | Disposition: A | Discharge status: Home / Self Care | Payer: Self-pay | Source: Ambulatory Visit | Attending: *Deleted | Admitting: *Deleted

## 2021-04-19 ENCOUNTER — Other Ambulatory Visit: Payer: Self-pay | Admitting: *Deleted

## 2021-04-19 DIAGNOSIS — Z1231 Encounter for screening mammogram for malignant neoplasm of breast: Secondary | ICD-10-CM

## 2021-04-27 DIAGNOSIS — H04129 Dry eye syndrome of unspecified lacrimal gland: Secondary | ICD-10-CM | POA: Diagnosis not present

## 2021-04-27 DIAGNOSIS — H02889 Meibomian gland dysfunction of unspecified eye, unspecified eyelid: Secondary | ICD-10-CM | POA: Diagnosis not present

## 2021-04-27 DIAGNOSIS — E119 Type 2 diabetes mellitus without complications: Secondary | ICD-10-CM | POA: Diagnosis not present

## 2021-04-27 DIAGNOSIS — H2513 Age-related nuclear cataract, bilateral: Secondary | ICD-10-CM | POA: Diagnosis not present

## 2021-04-27 LAB — HM DIABETES EYE EXAM

## 2021-05-05 ENCOUNTER — Ambulatory Visit (INDEPENDENT_AMBULATORY_CARE_PROVIDER_SITE_OTHER): Payer: Medicare PPO

## 2021-05-05 DIAGNOSIS — Z0000 Encounter for general adult medical examination without abnormal findings: Secondary | ICD-10-CM | POA: Diagnosis not present

## 2021-05-05 NOTE — Patient Instructions (Signed)
Alexandria Franklin , Thank you for taking time to come for your Medicare Wellness Visit. I appreciate your ongoing commitment to your health goals. Please review the following plan we discussed and let me know if I can assist you in the future.   Screening recommendations/referrals: Colonoscopy: done 08/26/12. Repeat 08/2022 Mammogram: done 04/06/21 Bone Density: done 04/10/20 Recommended yearly ophthalmology/optometry visit for glaucoma screening and checkup Recommended yearly dental visit for hygiene and checkup  Vaccinations: Influenza vaccine: declined Pneumococcal vaccine: declined Tdap vaccine: due Shingles vaccine: Shingrix discussed. Please contact your pharmacy for coverage information.  Covid-19: declined  Advanced directives: Advance directive discussed with you today. Even though you declined this today please call our office should you change your mind and we can give you the proper paperwork for you to fill out.   Conditions/risks identified: Recommend increasing physical activity as tolerated  Next appointment: Follow up in one year for your annual wellness visit    Preventive Care 73 Years and Older, Female Preventive care refers to lifestyle choices and visits with your health care provider that can promote health and wellness. What does preventive care include? A yearly physical exam. This is also called an annual well check. Dental exams once or twice a year. Routine eye exams. Ask your health care provider how often you should have your eyes checked. Personal lifestyle choices, including: Daily care of your teeth and gums. Regular physical activity. Eating a healthy diet. Avoiding tobacco and drug use. Limiting alcohol use. Practicing safe sex. Taking low-dose aspirin every day. Taking vitamin and mineral supplements as recommended by your health care provider. What happens during an annual well check? The services and screenings done by your health care provider during  your annual well check will depend on your age, overall health, lifestyle risk factors, and family history of disease. Counseling  Your health care provider may ask you questions about your: Alcohol use. Tobacco use. Drug use. Emotional well-being. Home and relationship well-being. Sexual activity. Eating habits. History of falls. Memory and ability to understand (cognition). Work and work Astronomer. Reproductive health. Screening  You may have the following tests or measurements: Height, weight, and BMI. Blood pressure. Lipid and cholesterol levels. These may be checked every 5 years, or more frequently if you are over 73 years old. Skin check. Lung cancer screening. You may have this screening every year starting at age 73 if you have a 30-pack-year history of smoking and currently smoke or have quit within the past 15 years. Fecal occult blood test (FOBT) of the stool. You may have this test every year starting at age 73. Flexible sigmoidoscopy or colonoscopy. You may have a sigmoidoscopy every 5 years or a colonoscopy every 10 years starting at age 73. Hepatitis C blood test. Hepatitis B blood test. Sexually transmitted disease (STD) testing. Diabetes screening. This is done by checking your blood sugar (glucose) after you have not eaten for a while (fasting). You may have this done every 1-3 years. Bone density scan. This is done to screen for osteoporosis. You may have this done starting at age 73. Mammogram. This may be done every 1-2 years. Talk to your health care provider about how often you should have regular mammograms. Talk with your health care provider about your test results, treatment options, and if necessary, the need for more tests. Vaccines  Your health care provider may recommend certain vaccines, such as: Influenza vaccine. This is recommended every year. Tetanus, diphtheria, and acellular pertussis (Tdap, Td) vaccine. You  may need a Td booster every 10  years. Zoster vaccine. You may need this after age 31. Pneumococcal 13-valent conjugate (PCV13) vaccine. One dose is recommended after age 73. Pneumococcal polysaccharide (PPSV23) vaccine. One dose is recommended after age 73. Talk to your health care provider about which screenings and vaccines you need and how often you need them. This information is not intended to replace advice given to you by your health care provider. Make sure you discuss any questions you have with your health care provider. Document Released: 03/27/2015 Document Revised: 11/18/2015 Document Reviewed: 12/30/2014 Elsevier Interactive Patient Education  2017 [place] Prevention in the Home Falls can cause injuries. They can happen to people of all ages. There are many things you can do to make your home safe and to help prevent falls. What can I do on the outside of my home? Regularly fix the edges of walkways and driveways and fix any cracks. Remove anything that might make you trip as you walk through a door, such as a raised step or threshold. Trim any bushes or trees on the path to your home. Use bright outdoor lighting. Clear any walking paths of anything that might make someone trip, such as rocks or tools. Regularly check to see if handrails are loose or broken. Make sure that both sides of any steps have handrails. Any raised decks and porches should have guardrails on the edges. Have any leaves, snow, or ice cleared regularly. Use sand or salt on walking paths during winter. Clean up any spills in your garage right away. This includes oil or grease spills. What can I do in the bathroom? Use night lights. Install grab bars by the toilet and in the tub and shower. Do not use towel bars as grab bars. Use non-skid mats or decals in the tub or shower. If you need to sit down in the shower, use a plastic, non-slip stool. Keep the floor dry. Clean up any water that spills on the floor as soon as it  happens. Remove soap buildup in the tub or shower regularly. Attach bath mats securely with double-sided non-slip rug tape. Do not have throw rugs and other things on the floor that can make you trip. What can I do in the bedroom? Use night lights. Make sure that you have a light by your bed that is easy to reach. Do not use any sheets or blankets that are too big for your bed. They should not hang down onto the floor. Have a firm chair that has side arms. You can use this for support while you get dressed. Do not have throw rugs and other things on the floor that can make you trip. What can I do in the kitchen? Clean up any spills right away. Avoid walking on wet floors. Keep items that you use a lot in easy-to-reach places. If you need to reach something above you, use a strong step stool that has a grab bar. Keep electrical cords out of the way. Do not use floor polish or wax that makes floors slippery. If you must use wax, use non-skid floor wax. Do not have throw rugs and other things on the floor that can make you trip. What can I do with my stairs? Do not leave any items on the stairs. Make sure that there are handrails on both sides of the stairs and use them. Fix handrails that are broken or loose. Make sure that handrails are as long as the  stairways. Check any carpeting to make sure that it is firmly attached to the stairs. Fix any carpet that is loose or worn. Avoid having throw rugs at the top or bottom of the stairs. If you do have throw rugs, attach them to the floor with carpet tape. Make sure that you have a light switch at the top of the stairs and the bottom of the stairs. If you do not have them, ask someone to add them for you. What else can I do to help prevent falls? Wear shoes that: Do not have high heels. Have rubber bottoms. Are comfortable and fit you well. Are closed at the toe. Do not wear sandals. If you use a stepladder: Make sure that it is fully opened.  Do not climb a closed stepladder. Make sure that both sides of the stepladder are locked into place. Ask someone to hold it for you, if possible. Clearly mark and make sure that you can see: Any grab bars or handrails. First and last steps. Where the edge of each step is. Use tools that help you move around (mobility aids) if they are needed. These include: Canes. Walkers. Scooters. Crutches. Turn on the lights when you go into a dark area. Replace any light bulbs as soon as they burn out. Set up your furniture so you have a clear path. Avoid moving your furniture around. If any of your floors are uneven, fix them. If there are any pets around you, be aware of where they are. Review your medicines with your doctor. Some medicines can make you feel dizzy. This can increase your chance of falling. Ask your doctor what other things that you can do to help prevent falls. This information is not intended to replace advice given to you by your health care provider. Make sure you discuss any questions you have with your health care provider. Document Released: 12/25/2008 Document Revised: 08/06/2015 Document Reviewed: 04/04/2014 Elsevier Interactive Patient Education  2017 [employer].

## 2021-05-05 NOTE — Progress Notes (Addendum)
Subjective:   Alexandria Franklin is a 71 y.o. female who presents for Initial Medicare Annual preventive examination.  Virtual Visit via Telephone Note  I connected with  Alexandria Franklin on 05/05/21 at  2:40 PM EST by telephone and verified that I am speaking with the correct person using two identifiers.  Location: Patient: home Provider: [facility] Persons participating in the virtual visit: patient/Nurse Health Advisor   I discussed the limitations, risks, security and privacy concerns of performing an evaluation and management service by telephone and the availability of in person appointments. The patient expressed understanding and agreed to proceed.  Interactive audio and video telecommunications were attempted between this nurse and patient, however failed, due to patient having technical difficulties OR patient did not have access to video capability.  We continued and completed visit with audio only.  Some vital signs may be absent or patient reported.   Reather Littler, LPN   Review of Systems     Cardiac Risk Factors include: advanced age (>17men, >82 women);diabetes mellitus;dyslipidemia;hypertension     Objective:    There were no vitals filed for this visit. There is no height or weight on file to calculate BMI.  Advanced Directives 05/05/2021 02/08/2021  Does Patient Have a Medical Advance Directive? No No  Would patient like information on creating a medical advance directive? No - Patient declined No - Patient declined    Current Medications (verified) Outpatient Encounter Medications as of 05/05/2021  Medication Sig   allopurinol (ZYLOPRIM) 100 MG tablet TAKE 1 TABLET DAILY   atorvastatin (LIPITOR) 10 MG tablet TAKE 1 TABLET DAILY   celecoxib (CELEBREX) 200 MG capsule TAKE 1 CAPSULE DAILY   lisinopril (ZESTRIL) 10 MG tablet TAKE 1 TABLET DAILY   metFORMIN (GLUCOPHAGE) 500 MG tablet TAKE 1 TABLET TWICE A DAY WITH MEALS   Multiple Vitamin (MULTIVITAMIN ADULT PO) Take  1 capsule by mouth daily.   omeprazole (PRILOSEC) 40 MG capsule TAKE 1 CAPSULE DAILY   oxymetazoline (AFRIN) 0.05 % nasal spray Place 1 spray into both nostrils 2 (two) times daily as needed for congestion.   SYNTHROID 50 MCG tablet TAKE 1 TABLET DAILY BEFORE BREAKFAST   No facility-administered encounter medications on file as of 05/05/2021.    Allergies (verified) Codeine   History: Past Medical History:  Diagnosis Date   Allergy    Diabetes (HCC)    Essential hypertension    GERD (gastroesophageal reflux disease)    Rheumatoid arthritis (HCC)    Thyroid disease    Past Surgical History:  Procedure Laterality Date   ABDOMINAL HYSTERECTOMY  1991   CHOLECYSTECTOMY, LAPAROSCOPIC  1980   Family History  Problem Relation Age of Onset   Cancer Mother    Thyroid disease Father    Diabetes Father    Mental illness Sister    Breast cancer Neg Hx    Social History   Socioeconomic History   Marital status: Widowed    Spouse name: Not on file   Number of children: 1   Years of education: 14   Highest education level: Associate degree: occupational, Scientist, product/process development, or vocational program  Occupational History   Not on file  Tobacco Use   Smoking status: Never    Passive exposure: Never   Smokeless tobacco: Never  Vaping Use   Vaping Use: Never used  Substance and Sexual Activity   Alcohol use: Not Currently   Drug use: Never   Sexual activity: Not Currently  Other Topics Concern  Not on file  Social History Narrative   Pt lives alone    Social Determinants of Health   Financial Resource Strain: Low Risk    Difficulty of Paying Living Expenses: Not very hard  Food Insecurity: No Food Insecurity   Worried About Programme researcher, broadcasting/film/videounning Out of Food in the Last Year: Never true   Baristaan Out of Food in the Last Year: Never true  Transportation Needs: No Transportation Needs   Lack of Transportation (Medical): No   Lack of Transportation (Non-Medical): No  Physical Activity: Inactive    Days of Exercise per Week: 0 days   Minutes of Exercise per Session: 0 min  Stress: No Stress Concern Present   Feeling of Stress : Only a little  Social Connections: Moderately Integrated   Frequency of Communication with Friends and Family: More than three times a week   Frequency of Social Gatherings with Friends and Family: Once a week   Attends Religious Services: More than 4 times per year   Active Member of [employer] or Organizations: Yes   Attends BankerClub or Organization Meetings: 1 to 4 times per year   Marital Status: Widowed    Tobacco Counseling Counseling given: Not Answered   Clinical Intake:  Pre-visit preparation completed: Yes  Pain : No/denies pain     Nutritional Risks: None Diabetes: Yes CBG done?: No Did pt. bring in CBG monitor from home?: No  How often do you need to have someone help you when you read instructions, pamphlets, or other written materials from your doctor or pharmacy?: 1 - Never  Nutrition Risk Assessment:  Has the patient had any N/V/D within the last 2 months?  No  Does the patient have any non-healing wounds?  No  Has the patient had any unintentional weight loss or weight gain?  No   Diabetes:  Is the patient diabetic?  Yes  If diabetic, was a CBG obtained today?  No  Did the patient bring in their glucometer from home?  No  How often do you monitor your CBG's? As directed.   Financial Strains and Diabetes Management:  Are you having any financial strains with the device, your supplies or your medication? No .  Does the patient want to be seen by Chronic Care Management for management of their diabetes?  No  Would the patient like to be referred to a Nutritionist or for Diabetic Management?  No   Diabetic Exams:  Diabetic Eye Exam: Completed 04/27/21; need records.  Diabetic Foot Exam: Completed 09/30/20.   Interpreter Needed?: No  Information entered by :: Reather LittlerKasey Tykera Skates LPN   Activities of Daily Living In your present  state of health, do you have any difficulty performing the following activities: 05/05/2021 05/05/2021  Hearing? N N  Vision? N N  Difficulty concentrating or making decisions? N N  Walking or climbing stairs? N N  Dressing or bathing? N N  Doing errands, shopping? N N  Preparing Food and eating ? N N  Using the Toilet? N N  In the past six months, have you accidently leaked urine? Y N  Do you have problems with loss of bowel control? N N  Managing your Medications? N N  Managing your Finances? N N  Housekeeping or managing your Housekeeping? N N    Patient Care Team: Reubin MilanBerglund, Laura H, MD as PCP - General (Internal Medicine) [facility]  Indicate any recent Medical Services you may have received from other than [facility] providers in the  past year (date may be approximate).     Assessment:   This is a routine wellness examination for Alexandria Franklin.  Hearing/Vision screen Hearing Screening - Comments:: Pt denies hearing difficulty Vision Screening - Comments:: [facility] screen[facility]  Dietary issues and exercise activities discussed: Current Exercise Habits: The patient does not participate in regular exercise at present, Exercise limited by: orthopedic condition(s);neurologic condition(s)   Goals Addressed   None    Depression Screen PHQ 2/9 Scores 05/05/2021 02/08/2021 09/30/2020  PHQ - 2 Score 0 0 0  PHQ- 9 Score - 2 4    Fall Risk Fall Risk  05/05/2021 05/05/2021 02/08/2021 09/30/2020  Falls in the past year? 1 1 0 0  Number falls in past yr: 0 0 0 0  Injury with Fall? 0 0 0 0  Risk for fall due to : No Fall Risks - No Fall Risks No Fall Risks  Follow up Falls prevention discussed - Falls evaluation completed Falls evaluation completed    FALL RISK PREVENTION PERTAINING TO THE HOME:  Any stairs in or around the home? Yes  If so, are there any without handrails? No  Home free of loose throw rugs in walkways, pet beds, electrical  cords, etc? Yes  Adequate lighting in your home to reduce risk of falls? Yes   ASSISTIVE DEVICES UTILIZED TO PREVENT FALLS:  Life alert? No  Use of a cane, walker or w/c? No  Grab bars in the bathroom? Yes  Shower chair or bench in shower? No  Elevated toilet seat or a handicapped toilet? No   TIMED UP AND GO:  Was the test performed? No . Telephonic visit  Cognitive Function: Normal cognitive status assessed by direct observation by this Nurse Health Advisor. No abnormalities found.          Immunizations  There is no immunization history on file for this patient.  TDAP status: Due, Education has been provided regarding the importance of this vaccine. Advised may receive this vaccine at local pharmacy or Health Dept. Aware to provide a copy of the vaccination record if obtained from local pharmacy or Health Dept. Verbalized acceptance and understanding.  Flu Vaccine status: Declined, Education has been provided regarding the importance of this vaccine but patient still declined. Advised may receive this vaccine at local pharmacy or Health Dept. Aware to provide a copy of the vaccination record if obtained from local pharmacy or Health Dept. Verbalized acceptance and understanding.  Pneumococcal vaccine status: Declined,  Education has been provided regarding the importance of this vaccine but patient still declined. Advised may receive this vaccine at local pharmacy or Health Dept. Aware to provide a copy of the vaccination record if obtained from local pharmacy or Health Dept. Verbalized acceptance and understanding.   Covid-19 vaccine status: Declined, Education has been provided regarding the importance of this vaccine but patient still declined. Advised may receive this vaccine at local pharmacy or Health Dept.or vaccine clinic. Aware to provide a copy of the vaccination record if obtained from local pharmacy or Health Dept. Verbalized acceptance and understanding.  Qualifies  for Shingles Vaccine? Yes   Zostavax completed No   Shingrix Completed?: No.    Education has been provided regarding the importance of this vaccine. Patient has been advised to call insurance company to determine out of pocket expense if they have not yet received this vaccine. Advised may also receive vaccine at local pharmacy or Health Dept. Verbalized acceptance and understanding.  Screening  Tests Health Maintenance  Topic Date Due   COVID-19 Vaccine (1) Never done   OPHTHALMOLOGY EXAM  Never done   Zoster Vaccines- Shingrix (1 of 2) 05/11/2021 (Originally 11/02/1998)   INFLUENZA VACCINE  06/11/2021 (Originally 10/12/2020)   TETANUS/TDAP  09/30/2021 (Originally [birth year]1967)   Pneumonia Vaccine 6+ Years old (1 - PCV) 02/08/2022 (Originally 11/01/2013)   HEMOGLOBIN A1C  08/08/2021   FOOT EXAM  09/30/2021   COLONOSCOPY (Pts 45-85yrs Insurance coverage will need to be confirmed)  08/27/2022   MAMMOGRAM  04/07/2023   DEXA SCAN  Completed   Hepatitis C Screening  Completed   HPV VACCINES  Aged Out    Health Maintenance  Health Maintenance Due  Topic Date Due   COVID-19 Vaccine (1) Never done   OPHTHALMOLOGY EXAM  Never done    Colorectal cancer screening: Type of screening: Colonoscopy. Completed 08/26/12. Repeat every 10 years  Mammogram status: Completed 04/06/21. Repeat every year  Bone density status: completed 04/10/20; results reflect unvailable. Repeat every 2 years.   Lung Cancer Screening: (Low Dose CT Chest recommended if Age 56-80 years, 30 pack-year currently smoking OR have quit w/in 15years.) does not qualify.   Additional Screening:  Hepatitis C Screening: does qualify; Completed 02/08/21  Vision Screening: Recommended annual ophthalmology exams for early detection of glaucoma and other disorders of the eye. Is the patient up to date with their annual eye exam?  Yes  Who is the provider or what is the name of the office in which the patient attends annual eye exams?  [facility].   Dental Screening: Recommended annual dental exams for proper oral hygiene  Community Resource Referral / Chronic Care Management: CRR required this visit?  No   CCM required this visit?  No      Plan:     I have personally reviewed and noted the following in the patients chart:   Medical and social history Use of alcohol, tobacco or illicit drugs  Current medications and supplements including opioid prescriptions.  Functional ability and status Nutritional status Physical activity Advanced directives List of other physicians Hospitalizations, surgeries, and ER visits in previous 12 months Vitals Screenings to include cognitive, depression, and falls Referrals and appointments  In addition, I have reviewed and discussed with patient certain preventive protocols, quality metrics, and best practice recommendations. A written personalized care plan for preventive services as well as general preventive health recommendations were provided to patient.     Reather Littler, LPN   11/26/7827   Nurse Notes: none

## 2021-06-09 ENCOUNTER — Ambulatory Visit
Admission: RE | Admit: 2021-06-09 | Discharge: 2021-06-09 | Disposition: A | Discharge status: Home / Self Care | Payer: Medicare PPO | Source: Ambulatory Visit | Attending: Internal Medicine | Admitting: Internal Medicine

## 2021-06-09 ENCOUNTER — Other Ambulatory Visit: Payer: Self-pay

## 2021-06-09 ENCOUNTER — Encounter: Payer: Self-pay | Admitting: Internal Medicine

## 2021-06-09 ENCOUNTER — Ambulatory Visit
Admission: RE | Admit: 2021-06-09 | Discharge: 2021-06-09 | Disposition: A | Discharge status: Home / Self Care | Payer: Medicare PPO | Attending: Internal Medicine | Admitting: Internal Medicine

## 2021-06-09 ENCOUNTER — Ambulatory Visit (INDEPENDENT_AMBULATORY_CARE_PROVIDER_SITE_OTHER): Payer: Medicare PPO | Admitting: Internal Medicine

## 2021-06-09 VITALS — BP 122/84 | HR 68 | Ht 62.0 in | Wt 211.4 lb

## 2021-06-09 DIAGNOSIS — I10 Essential (primary) hypertension: Secondary | ICD-10-CM

## 2021-06-09 DIAGNOSIS — E039 Hypothyroidism, unspecified: Secondary | ICD-10-CM | POA: Diagnosis not present

## 2021-06-09 DIAGNOSIS — E1169 Type 2 diabetes mellitus with other specified complication: Secondary | ICD-10-CM

## 2021-06-09 DIAGNOSIS — R053 Chronic cough: Secondary | ICD-10-CM | POA: Insufficient documentation

## 2021-06-09 DIAGNOSIS — E785 Hyperlipidemia, unspecified: Secondary | ICD-10-CM | POA: Diagnosis not present

## 2021-06-09 DIAGNOSIS — E118 Type 2 diabetes mellitus with unspecified complications: Secondary | ICD-10-CM

## 2021-06-09 DIAGNOSIS — Z6838 Body mass index (BMI) 38.0-38.9, adult: Secondary | ICD-10-CM | POA: Insufficient documentation

## 2021-06-09 DIAGNOSIS — Z0000 Encounter for general adult medical examination without abnormal findings: Secondary | ICD-10-CM | POA: Diagnosis not present

## 2021-06-09 DIAGNOSIS — E790 Hyperuricemia without signs of inflammatory arthritis and tophaceous disease: Secondary | ICD-10-CM | POA: Diagnosis not present

## 2021-06-09 NOTE — Progress Notes (Signed)
? ? ?Date:  06/09/2021  ? ?Name:  Alexandria Franklin   DOB:  1950/10/23   MRN:  [id number] ? ? ?Chief Complaint: Annual Exam ?Alexandria Franklin is a 71 y.o. female who presents today for her Complete Annual Exam. She feels well. She reports exercising- not at this time. She reports she is sleeping fairly well. Breast complaints - none. ? ?Mammogram: 03/2021 ?DEXA: 03/2020 normal per patient ?Pap smear: discontinued ?Colonoscopy: 08/2012 ? ?Health Maintenance Due  ?Topic Date Due  ? OPHTHALMOLOGY EXAM  Never done  ?  ? ?There is no immunization history on file for this patient. ? ?Hypertension ?This is a chronic problem. The problem is controlled. Associated symptoms include shortness of breath. Pertinent negatives include no chest pain, headaches or palpitations. Past treatments include ACE inhibitors. The current treatment provides significant improvement. Hypertensive end-organ damage includes kidney disease. There is no history of CAD/MI or CVA. Identifiable causes of hypertension include a thyroid problem.  ?Diabetes ?She presents for her follow-up diabetic visit. She has type 2 diabetes mellitus. Her disease course has been stable. Pertinent negatives for hypoglycemia include no dizziness, headaches, nervousness/anxiousness or tremors. Pertinent negatives for diabetes include no chest pain, no fatigue, no polydipsia and no polyuria. Pertinent negatives for diabetic complications include no CVA. Current diabetic treatment includes oral agent (monotherapy) (metformin). Her breakfast blood glucose is taken between 6-7 am. Her breakfast blood glucose range is generally 110-130 mg/dl. An ACE inhibitor/angiotensin II receptor blocker is being taken. Eye exam is current.  ?Thyroid Problem ?Presents for follow-up visit. Patient reports no anxiety, constipation, diarrhea, fatigue, palpitations or tremors. The symptoms have been stable. Her past medical history is significant for hyperlipidemia.  ?Hyperlipidemia ?This is a chronic  problem. The problem is controlled. Associated symptoms include shortness of breath. Pertinent negatives include no chest pain. Current antihyperlipidemic treatment includes statins.  ?Cough ?This is a chronic problem. The problem has been gradually worsening. The problem occurs hourly. The cough is Non-productive. Associated symptoms include heartburn and shortness of breath. Pertinent negatives include no chest pain, chills, fever, headaches, rash or wheezing. The symptoms are aggravated by exercise. She has tried nothing for the symptoms. There is no history of asthma or emphysema. she is on lisinopril that may have been started around the same time as the cough started  ?Gastroesophageal Reflux ?She complains of coughing and heartburn. She reports no abdominal pain, no chest pain or no wheezing. This is a recurrent problem. Pertinent negatives include no fatigue. She has tried a PPI for the symptoms.  ? ?Lab Results  ?Component Value Date  ? NA 144 02/08/2021  ? K 5.0 02/08/2021  ? CO2 20 02/08/2021  ? GLUCOSE 118 (H) 02/08/2021  ? BUN 13 02/08/2021  ? CREATININE 1.08 (H) 02/08/2021  ? CALCIUM 9.5 02/08/2021  ? EGFR 55 (L) 02/08/2021  ? GFRNONAA 53 01/23/2020  ? ?Lab Results  ?Component Value Date  ? CHOL 161 02/08/2021  ? HDL 43 02/08/2021  ? [place] 92 02/08/2021  ? TRIG 148 02/08/2021  ? CHOLHDL 3.7 02/08/2021  ? ?Lab Results  ?Component Value Date  ? TSH 2.170 09/30/2020  ? ?Lab Results  ?Component Value Date  ? HGBA1C 6.5 (H) 02/08/2021  ? ?Lab Results  ?Component Value Date  ? WBC 13.4 (H) 09/30/2020  ? HGB 13.4 09/30/2020  ? HCT 39.8 09/30/2020  ? MCV 89 09/30/2020  ? PLT 428 09/30/2020  ? ?Lab Results  ?Component Value Date  ? ALT 10 02/08/2021  ?  AST 12 02/08/2021  ? ALKPHOS 81 02/08/2021  ? BILITOT 0.6 02/08/2021  ? ?No results found for: 25OHVITD2, [place], VD25OH  ? ?Review of Systems  ?Constitutional:  Negative for chills, fatigue, fever and unexpected weight change.  ?HENT:  Negative for  congestion, hearing loss, tinnitus, trouble swallowing and voice change.   ?Eyes:  Negative for visual disturbance.  ?Respiratory:  Positive for cough and shortness of breath. Negative for chest tightness and wheezing.   ?Cardiovascular:  Negative for chest pain, palpitations and leg swelling.  ?Gastrointestinal:  Positive for heartburn. Negative for abdominal pain, constipation, diarrhea and vomiting.  ?Endocrine: Negative for polydipsia and polyuria.  ?Genitourinary:  Negative for dysuria, frequency, genital sores, vaginal bleeding and vaginal discharge.  ?Musculoskeletal:  Negative for arthralgias, gait problem and joint swelling.  ?Skin:  Negative for color change and rash.  ?Neurological:  Negative for dizziness, tremors, light-headedness and headaches.  ?Hematological:  Negative for adenopathy. Does not bruise/bleed easily.  ?Psychiatric/Behavioral:  Negative for dysphoric mood and sleep disturbance. The patient is not nervous/anxious.   ? ?Patient Active Problem List  ? Diagnosis Date Noted  ? Hyperlipidemia associated with type 2 diabetes mellitus ([place]) 10/07/2020  ? Acquired hypothyroidism 09/30/2020  ? Asymptomatic hyperuricemia 09/30/2020  ? Environmental and seasonal allergies 09/30/2020  ? Morbid obesity ([place]) 09/30/2020  ? 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 Type II diabetes mellitus with complication ([place]) 45/85/9292  ? Essential hypertension 09/23/2020  ? GERD (gastroesophageal reflux disease) 09/23/2020  ? Osteoarthritis of both knees 09/23/2020  ? ? ?Allergies  ?Allergen Reactions  ? Codeine Nausea Only  ? ? ?Past Surgical History:  ?Procedure Laterality Date  ? ABDOMINAL HYSTERECTOMY  1991  ? CHOLECYSTECTOMY, LAPAROSCOPIC  1980  ? ? ?Social History  ? ?Tobacco Use  ? Smoking status: Never  ?  Passive exposure: Never  ? Smokeless tobacco: Never  ?Vaping Use  ? Vaping Use: Never used  ?Substance Use Topics  ? Alcohol use: Not Currently  ? Drug use: Never  ? ? ? ?Medication  list has been reviewed and updated. ? ?Current Meds  ?Medication Sig  ? allopurinol (ZYLOPRIM) 100 MG tablet TAKE 1 TABLET DAILY  ? atorvastatin (LIPITOR) 10 MG tablet TAKE 1 TABLET DAILY  ? celecoxib (CELEBREX) 200 MG capsule TAKE 1 CAPSULE DAILY  ? lisinopril (ZESTRIL) 10 MG tablet TAKE 1 TABLET DAILY  ? metFORMIN (GLUCOPHAGE) 500 MG tablet TAKE 1 TABLET TWICE A DAY WITH MEALS  ? Multiple Vitamin (MULTIVITAMIN ADULT PO) Take 1 capsule by mouth daily.  ? omeprazole (PRILOSEC) 40 MG capsule TAKE 1 CAPSULE DAILY  ? oxymetazoline (AFRIN) 0.05 % nasal spray Place 1 spray into both nostrils 2 (two) times daily as needed for congestion.  ? SYNTHROID 50 MCG tablet TAKE 1 TABLET DAILY BEFORE BREAKFAST  ? ? ? ?  06/09/2021  ? 10:14 AM 02/08/2021  ?  8:01 AM 09/30/2020  ?  2:03 PM  ?GAD 7 : Generalized Anxiety Score  ?Nervous, Anxious, on Edge 0 0 0  ?Control/stop worrying 1 0 0  ?Worry too much - different things 1 0 0  ?Trouble relaxing 0 0 0  ?Restless 1 0 0  ?Easily annoyed or irritable 0 1 1  ?Afraid - awful might happen 0 0 0  ?Total GAD 7 Score _0 ?Anxiety Difficulty  Not difficult at all Not difficult at all  ? ? ? ?  06/09/2021  ? 10:14 AM  ?Depression screen PHQ 2/9  ?  Decreased Interest 0  ?Down, Depressed, Hopeless 0  ?PHQ - 2 Score 0  ?Altered sleeping 1  ?Tired, decreased energy 1  ?Change in appetite 0  ?Feeling bad or failure about yourself  0  ?Trouble concentrating 0  ?Moving slowly or fidgety/restless 0  ?Suicidal thoughts 0  ?PHQ-9 Score 2  ?Difficult doing work/chores Not difficult at all  ? ? ?BP Readings from Last 3 Encounters:  ?06/09/21 122/84  ?02/08/21 136/78  ?09/30/20 122/62  ? ? ?Physical Exam ?Vitals and nursing note reviewed.  ?Constitutional:   ?   General: She is not in acute distress. ?   Appearance: Normal appearance. She is well-developed.  ?HENT:  ?   Head: Normocephalic and atraumatic.  ?   Right Ear: Tympanic membrane and ear canal normal.  ?   Left Ear: Tympanic membrane and ear canal  normal.  ?   Nose:  ?   Right Sinus: No maxillary sinus tenderness.  ?   Left Sinus: No maxillary sinus tenderness.  ?Eyes:  ?   General: No scleral icterus.    ?   Right eye: No discharge.     ?   Left eye:

## 2021-06-09 NOTE — Patient Instructions (Addendum)
Hold Lisinopril for 2 weeks.  Message me to report the response ? ?Health Maintenance, Female ?Adopting a healthy lifestyle and getting preventive care are important in promoting health and wellness. Ask your health care provider about: ?The right schedule for you to have regular tests and exams. ?Things you can do on your own to prevent diseases and keep yourself healthy. ?What should I know about diet, weight, and exercise? ?Eat a healthy diet ? ?Eat a diet that includes plenty of vegetables, fruits, low-fat dairy products, and lean protein. ?Do not eat a lot of foods that are high in solid fats, added sugars, or sodium. ?Maintain a healthy weight ?Body mass index (BMI) is used to identify weight problems. It estimates body fat based on height and weight. Your health care provider can help determine your BMI and help you achieve or maintain a healthy weight. ?Get regular exercise ?Get regular exercise. This is one of the most important things you can do for your health. Most adults should: ?Exercise for at least 150 minutes each week. The exercise should increase your heart rate and make you sweat (moderate-intensity exercise). ?Do strengthening exercises at least twice a week. This is in addition to the moderate-intensity exercise. ?Spend less time sitting. Even light physical activity can be beneficial. ?Watch cholesterol and blood lipids ?Have your blood tested for lipids and cholesterol at 73 years of age, then have this test every 5 years. ?Have your cholesterol levels checked more often if: ?Your lipid or cholesterol levels are high. ?You are older than 73 years of age. ?You are at high risk for heart disease. ?What should I know about cancer screening? ?Depending on your health history and family history, you may need to have cancer screening at various ages. This may include screening for: ?Breast cancer. ?Cervical cancer. ?Colorectal cancer. ?Skin cancer. ?Lung cancer. ?What should I know about heart  disease, diabetes, and high blood pressure? ?Blood pressure and heart disease ?High blood pressure causes heart disease and increases the risk of stroke. This is more likely to develop in people who have high blood pressure readings or are overweight. ?Have your blood pressure checked: ?Every 3-5 years if you are 28-25 years of age. ?Every year if you are 50 years old or older. ?Diabetes ?Have regular diabetes screenings. This checks your fasting blood sugar level. Have the screening done: ?Once every three years after age 22 if you are at a normal weight and have a low risk for diabetes. ?More often and at a younger age if you are overweight or have a high risk for diabetes. ?What should I know about preventing infection? ?Hepatitis B ?If you have a higher risk for hepatitis B, you should be screened for this virus. Talk with your health care provider to find out if you are at risk for hepatitis B infection. ?Hepatitis C ?Testing is recommended for: ?Everyone born from [birth year] through 1967. ?Anyone with known risk factors for hepatitis C. ?Sexually transmitted infections (STIs) ?Get screened for STIs, including gonorrhea and chlamydia, if: ?You are sexually active and are younger than 73 years of age. ?You are older than 73 years of age and your health care provider tells you that you are at risk for this type of infection. ?Your sexual activity has changed since you were last screened, and you are at increased risk for chlamydia or gonorrhea. Ask your health care provider if you are at risk. ?Ask your health care provider about whether you are at high risk for HIV.  Your health care provider may recommend a prescription medicine to help prevent HIV infection. If you choose to take medicine to prevent HIV, you should first get tested for HIV. You should then be tested every 3 months for as long as you are taking the medicine. ?Pregnancy ?If you are about to stop having your period (premenopausal) and you may become  pregnant, seek counseling before you get pregnant. ?Take 400 to 800 micrograms (mcg) of folic acid every day if you become pregnant. ?Ask for birth control (contraception) if you want to prevent pregnancy. ?Osteoporosis and menopause ?Osteoporosis is a disease in which the bones lose minerals and strength with aging. This can result in bone fractures. If you are 27 years old or older, or if you are at risk for osteoporosis and fractures, ask your health care provider if you should: ?Be screened for bone loss. ?Take a calcium or vitamin D supplement to lower your risk of fractures. ?Be given hormone replacement therapy (HRT) to treat symptoms of menopause. ?Follow these instructions at home: ?Alcohol use ?Do not drink alcohol if: ?Your health care provider tells you not to drink. ?You are pregnant, may be pregnant, or are planning to become pregnant. ?If you drink alcohol: ?Limit how much you have to: ?0-1 drink a day. ?Know how much alcohol is in your drink. In the [place] one drink equals one 12 oz bottle of beer (355 mL), one 5 oz glass of wine (148 mL), or one 1? oz glass of hard liquor (44 mL). ?Lifestyle ?Do not use any products that contain nicotine or tobacco. These products include cigarettes, chewing tobacco, and vaping devices, such as e-cigarettes. If you need help quitting, ask your health care provider. ?Do not use street drugs. ?Do not share needles. ?Ask your health care provider for help if you need support or information about quitting drugs. ?General instructions ?Schedule regular health, dental, and eye exams. ?Stay current with your vaccines. ?Tell your health care provider if: ?You often feel depressed. ?You have ever been abused or do not feel safe at home. ?Summary ?Adopting a healthy lifestyle and getting preventive care are important in promoting health and wellness. ?Follow your health care provider's instructions about healthy diet, exercising, and getting tested or screened for  diseases. ?Follow your health care provider's instructions on monitoring your cholesterol and blood pressure. ?This information is not intended to replace advice given to you by your health care provider. Make sure you discuss any questions you have with your health care provider. ?Document Revised: 07/20/2020 Document Reviewed: 07/20/2020 ?Elsevier Patient Education ? 2022 Elsevier Inc. ? ? ? ?

## 2021-06-10 LAB — TSH+FREE T4
Free T4: 1.06 ng/dL (ref 0.82–1.77)
TSH: 2.79 u[IU]/mL (ref 0.450–4.500)

## 2021-06-10 LAB — MICROALBUMIN / CREATININE URINE RATIO
Creatinine, Urine: 61.4 mg/dL
Microalb/Creat Ratio: <5 mg/g creat (ref 0–29)
Microalbumin, Urine: <3 ug/mL

## 2021-06-10 LAB — COMPREHENSIVE METABOLIC PANEL
ALT: 11 IU/L (ref 0–32)
AST: 15 IU/L (ref 0–40)
Albumin/Globulin Ratio: 2.1 (ref 1.2–2.2)
Albumin: 4.4 g/dL (ref 3.7–4.7)
Alkaline Phosphatase: 87 IU/L (ref 44–121)
BUN/Creatinine Ratio: 12 (ref 12–28)
BUN: 13 mg/dL (ref 8–27)
Bilirubin Total: 0.6 mg/dL (ref 0.0–1.2)
CO2: 22 mmol/L (ref 20–29)
Calcium: 9.6 mg/dL (ref 8.7–10.3)
Chloride: 106 mmol/L (ref 96–106)
Creatinine, Ser: 1.13 mg/dL — ABNORMAL HIGH (ref 0.57–1.00)
Globulin, Total: 2.1 g/dL (ref 1.5–4.5)
Glucose: 111 mg/dL — ABNORMAL HIGH (ref 70–99)
Potassium: 4.6 mmol/L (ref 3.5–5.2)
Sodium: 142 mmol/L (ref 134–144)
Total Protein: 6.5 g/dL (ref 6.0–8.5)
eGFR: 52 mL/min/{1.73_m2} — ABNORMAL LOW (ref >59)

## 2021-06-10 LAB — CBC WITH DIFFERENTIAL/PLATELET
Basophils Absolute: 0.1 10*3/uL (ref 0.0–0.2)
Basos: 1 %
EOS (ABSOLUTE): 0.5 10*3/uL — ABNORMAL HIGH (ref 0.0–0.4)
Eos: 4 %
Hematocrit: 38.5 % (ref 34.0–46.6)
Hemoglobin: 13.2 g/dL (ref 11.1–15.9)
Immature Grans (Abs): 0 10*3/uL (ref 0.0–0.1)
Immature Granulocytes: 0 %
Lymphocytes Absolute: 3.5 10*3/uL — ABNORMAL HIGH (ref 0.7–3.1)
Lymphs: 33 %
MCH: 30 pg (ref 26.6–33.0)
MCHC: 34.3 g/dL (ref 31.5–35.7)
MCV: 88 fL (ref 79–97)
Monocytes Absolute: 0.5 10*3/uL (ref 0.1–0.9)
Monocytes: 5 %
Neutrophils Absolute: 6 10*3/uL (ref 1.4–7.0)
Neutrophils: 57 %
Platelets: 368 10*3/uL (ref 150–450)
RBC: 4.4 x10E6/uL (ref 3.77–5.28)
RDW: 13.4 % (ref 11.7–15.4)
WBC: 10.6 10*3/uL (ref 3.4–10.8)

## 2021-06-10 LAB — URIC ACID: Uric Acid: 5 mg/dL (ref 3.1–7.9)

## 2021-06-10 LAB — HEMOGLOBIN A1C
Est. average glucose Bld gHb Est-mCnc: 137 mg/dL
Hgb A1c MFr Bld: 6.4 % — ABNORMAL HIGH (ref 4.8–5.6)

## 2021-06-14 ENCOUNTER — Encounter: Payer: Self-pay | Admitting: Internal Medicine

## 2021-06-23 ENCOUNTER — Encounter: Payer: Self-pay | Admitting: Internal Medicine

## 2021-09-13 ENCOUNTER — Other Ambulatory Visit: Payer: Self-pay | Admitting: Internal Medicine

## 2021-09-13 DIAGNOSIS — E790 Hyperuricemia without signs of inflammatory arthritis and tophaceous disease: Secondary | ICD-10-CM

## 2021-09-13 DIAGNOSIS — M170 Bilateral primary osteoarthritis of knee: Secondary | ICD-10-CM

## 2021-09-13 DIAGNOSIS — E118 Type 2 diabetes mellitus with unspecified complications: Secondary | ICD-10-CM

## 2021-09-13 DIAGNOSIS — E039 Hypothyroidism, unspecified: Secondary | ICD-10-CM

## 2021-09-13 NOTE — Telephone Encounter (Signed)
Requested Prescriptions  Pending Prescriptions Disp Refills  . metFORMIN (GLUCOPHAGE) 500 MG tablet [Pharmacy Med Name: METFORMIN HCL TABS $Remove'[id number]$ ] 180 tablet 0    Sig: TAKE 1 TABLET TWICE A DAY WITH MEALS     Endocrinology:  Diabetes - Biguanides Failed - 09/13/2021  3:42 AM      Failed - Cr in normal range and within 360 days    Creatinine, Ser  Date Value Ref Range Status  06/09/2021 1.13 (H) 0.57 - 1.00 mg/dL Final         Failed - eGFR in normal range and within 360 days    GFR calc Af Amer  Date Value Ref Range Status  01/23/2020 62  Final   GFR calc non Af Amer  Date Value Ref Range Status  01/23/2020 53  Final   eGFR  Date Value Ref Range Status  06/09/2021 52 (L) >59 mL/min/1.73 Final         Failed - B12 Level in normal range and within 720 days    No results found for: "VITAMINB12"       Passed - HBA1C is between 0 and 7.9 and within 180 days    Hemoglobin A1C  Date Value Ref Range Status  01/23/2020 6.4  Final   Hgb A1c MFr Bld  Date Value Ref Range Status  06/09/2021 6.4 (H) 4.8 - 5.6 % Final    Comment:             Prediabetes: 5.7 - 6.4          Diabetes: >6.4          Glycemic control for adults with diabetes: <7.0          Passed - Valid encounter within last 6 months    Recent Outpatient Visits          3 months ago Annual physical exam   [facility] Glean Hess, MD   7 months ago Type II diabetes mellitus with complication [facility])   [facility] Glean Hess, MD   11 months ago Gastroesophageal reflux disease, unspecified whether esophagitis present   [facility] Glean Hess, MD      Future Appointments            In 1 month Glean Hess, MD [facility]           Passed - CBC within normal limits and completed in the last 12 months    WBC  Date Value Ref Range Status  06/09/2021 10.6 3.4 - 10.8 x10E3/uL Final   RBC  Date Value Ref Range Status  06/09/2021 4.40 3.77 -  5.28 x10E6/uL Final  07/22/2019 4.67 3.87 - 5.11 Final   Hemoglobin  Date Value Ref Range Status  06/09/2021 13.2 11.1 - 15.9 g/dL Final   Hematocrit  Date Value Ref Range Status  06/09/2021 38.5 34.0 - 46.6 % Final   MCHC  Date Value Ref Range Status  06/09/2021 34.3 31.5 - 35.7 g/dL Final   [facility]  Date Value Ref Range Status  06/09/2021 30.0 26.6 - 33.0 pg Final   MCV  Date Value Ref Range Status  06/09/2021 88 79 - 97 fL Final   No results found for: "PLTCOUNTKUC", "LABPLAT", "POCPLA" RDW  Date Value Ref Range Status  06/09/2021 13.4 11.7 - 15.4 % Final         . celecoxib (CELEBREX) 200 MG capsule [Pharmacy Med Name: CELECOXIB CAPS $RemoveBefore'200MG'[id number]$ ] 90 capsule  2    Sig: TAKE 1 CAPSULE DAILY     Analgesics:  COX2 Inhibitors Failed - 09/13/2021  3:42 AM      Failed - Manual Review: Labs are only required if the patient has taken medication for more than 8 weeks.      Failed - Cr in normal range and within 360 days    Creatinine, Ser  Date Value Ref Range Status  06/09/2021 1.13 (H) 0.57 - 1.00 mg/dL Final         Passed - HGB in normal range and within 360 days    Hemoglobin  Date Value Ref Range Status  06/09/2021 13.2 11.1 - 15.9 g/dL Final         Passed - HCT in normal range and within 360 days    Hematocrit  Date Value Ref Range Status  06/09/2021 38.5 34.0 - 46.6 % Final         Passed - AST in normal range and within 360 days    AST  Date Value Ref Range Status  06/09/2021 15 0 - 40 IU/L Final         Passed - ALT in normal range and within 360 days    ALT  Date Value Ref Range Status  06/09/2021 11 0 - 32 IU/L Final         Passed - eGFR is 30 or above and within 360 days    GFR calc Af Amer  Date Value Ref Range Status  01/23/2020 62  Final   GFR calc non Af Amer  Date Value Ref Range Status  01/23/2020 53  Final   eGFR  Date Value Ref Range Status  06/09/2021 52 (L) >59 mL/min/1.73 Final         Passed - Patient is not pregnant       Passed - Valid encounter within last 12 months    Recent Outpatient Visits          3 months ago Annual physical exam   [facility] Glean Hess, MD   7 months ago Type II diabetes mellitus with complication [facility])   [facility] Glean [facility], MD   11 months ago Gastroesophageal reflux disease, unspecified whether esophagitis present   [facility] Glean Hess, MD      Future Appointments            In 1 month Army Melia Jesse Sans, MD [facility] [place]           . SYNTHROID 50 MCG tablet [Pharmacy Med Name: SYNTHROID TABS 50MCG] 90 tablet 2    Sig: TAKE 1 TABLET DAILY BEFORE BREAKFAST     Endocrinology:  Hypothyroid Agents Passed - 09/13/2021  3:42 AM      Passed - TSH in normal range and within 360 days    TSH  Date Value Ref Range Status  06/09/2021 2.790 0.450 - 4.500 uIU/mL Final         Passed - Valid encounter within last 12 months    Recent Outpatient Visits          3 months ago Annual physical exam   [facility] Glean Hess, MD   7 months ago Type II diabetes mellitus with complication [facility])   [facility] Glean Hess, MD   11 months ago Gastroesophageal reflux disease, unspecified whether esophagitis present   [facility] Glean Hess, MD  Future Appointments            In 1 month Army Melia, Jesse Sans, MD [facility] [place]           . allopurinol (ZYLOPRIM) 100 MG tablet [Pharmacy Med Name: ALLOPURINOL TABS $RemoveBeforeDE'100MG'[id number]$ ] 90 tablet 2    Sig: TAKE 1 TABLET DAILY     Endocrinology:  Gout Agents - allopurinol Failed - 09/13/2021  3:42 AM      Failed - Cr in normal range and within 360 days    Creatinine, Ser  Date Value Ref Range Status  06/09/2021 1.13 (H) 0.57 - 1.00 mg/dL Final         Passed - Uric Acid in normal range and within 360 days    Uric Acid  Date Value Ref Range Status  06/09/2021 5.0 3.1 - 7.9 mg/dL Final    Comment:                Therapeutic target for gout patients: <6.0         Passed - Valid encounter within last 12 months    Recent Outpatient Visits          3 months ago Annual physical exam   [facility] Glean Hess, MD   7 months ago Type II diabetes mellitus with complication [facility])   [facility] Glean Hess, MD   11 months ago Gastroesophageal reflux disease, unspecified whether esophagitis present   [facility] Glean Hess, MD      Future Appointments            In 1 month Glean Hess, MD [facility] [place] within normal limits and completed in the last 12 months    WBC  Date Value Ref Range Status  06/09/2021 10.6 3.4 - 10.8 x10E3/uL Final   RBC  Date Value Ref Range Status  06/09/2021 4.40 3.77 - 5.28 x10E6/uL Final  07/22/2019 4.67 3.87 - 5.11 Final   Hemoglobin  Date Value Ref Range Status  06/09/2021 13.2 11.1 - 15.9 g/dL Final   Hematocrit  Date Value Ref Range Status  06/09/2021 38.5 34.0 - 46.6 % Final   MCHC  Date Value Ref Range Status  06/09/2021 34.3 31.5 - 35.7 g/dL Final   [facility]  Date Value Ref Range Status  06/09/2021 30.0 26.6 - 33.0 pg Final   MCV  Date Value Ref Range Status  06/09/2021 88 79 - 97 fL Final   No results found for: "PLTCOUNTKUC", "LABPLAT", "POCPLA" RDW  Date Value Ref Range Status  06/09/2021 13.4 11.7 - 15.4 % Final

## 2021-09-15 ENCOUNTER — Other Ambulatory Visit: Payer: Self-pay | Admitting: Internal Medicine

## 2021-09-15 DIAGNOSIS — E785 Hyperlipidemia, unspecified: Secondary | ICD-10-CM

## 2021-09-15 NOTE — Telephone Encounter (Signed)
Requested Prescriptions  Pending Prescriptions Disp Refills  . atorvastatin (LIPITOR) 10 MG tablet [Pharmacy Med Name: ATORVASTATIN TABS 10MG ] 90 tablet 1    Sig: TAKE 1 TABLET DAILY     Cardiovascular:  Antilipid - Statins Failed - 09/15/2021  3:30 AM      Failed - Lipid Panel in normal range within the last 12 months    Cholesterol, Total  Date Value Ref Range Status  02/08/2021 161 100 - 199 mg/dL Final   LDL Chol Calc (NIH)  Date Value Ref Range Status  02/08/2021 92 0 - 99 mg/dL Final   HDL  Date Value Ref Range Status  02/08/2021 43 >39 mg/dL Final   Triglycerides  Date Value Ref Range Status  02/08/2021 148 0 - 149 mg/dL Final         Passed - Patient is not pregnant      Passed - Valid encounter within last 12 months    Recent Outpatient Visits          3 months ago Annual physical exam   [facility], MD   7 months ago Type II diabetes mellitus with complication [facility])   [facility], MD   11 months ago Gastroesophageal reflux disease, unspecified whether esophagitis present   [facility], MD      Future Appointments            In 1 month Reubin Milan, Judithann Graves, MD [facility]

## 2021-09-22 DIAGNOSIS — E119 Type 2 diabetes mellitus without complications: Secondary | ICD-10-CM | POA: Diagnosis not present

## 2021-09-22 DIAGNOSIS — H2513 Age-related nuclear cataract, bilateral: Secondary | ICD-10-CM | POA: Diagnosis not present

## 2021-09-22 DIAGNOSIS — H02889 Meibomian gland dysfunction of unspecified eye, unspecified eyelid: Secondary | ICD-10-CM | POA: Diagnosis not present

## 2021-09-22 DIAGNOSIS — H04129 Dry eye syndrome of unspecified lacrimal gland: Secondary | ICD-10-CM | POA: Diagnosis not present

## 2021-10-25 ENCOUNTER — Encounter: Payer: Self-pay | Admitting: Internal Medicine

## 2021-10-25 ENCOUNTER — Ambulatory Visit (INDEPENDENT_AMBULATORY_CARE_PROVIDER_SITE_OTHER): Payer: Medicare PPO | Admitting: Internal Medicine

## 2021-10-25 VITALS — BP 128/72 | HR 62 | Ht 62.0 in | Wt 213.0 lb

## 2021-10-25 DIAGNOSIS — E1129 Type 2 diabetes mellitus with other diabetic kidney complication: Secondary | ICD-10-CM | POA: Diagnosis not present

## 2021-10-25 DIAGNOSIS — E118 Type 2 diabetes mellitus with unspecified complications: Secondary | ICD-10-CM | POA: Diagnosis not present

## 2021-10-25 DIAGNOSIS — N1831 Chronic kidney disease, stage 3a: Secondary | ICD-10-CM | POA: Diagnosis not present

## 2021-10-25 DIAGNOSIS — R809 Proteinuria, unspecified: Secondary | ICD-10-CM

## 2021-10-25 DIAGNOSIS — R0981 Nasal congestion: Secondary | ICD-10-CM | POA: Diagnosis not present

## 2021-10-25 MED ORDER — LOSARTAN POTASSIUM 25 MG PO TABS: 25.0000 mg | ORAL_TABLET | Freq: Every day | ORAL | 3 refills | Status: DC

## 2021-10-25 NOTE — Progress Notes (Signed)
Date:  10/25/2021   Name:  Alexandria Franklin   DOB:  Jan 27, 1951   MRN:  [id number]   Chief Complaint: Diabetes and Hypertension  Diabetes She presents for her follow-up diabetic visit. She has type 2 diabetes mellitus. Her disease course has been stable. Pertinent negatives for hypoglycemia include no headaches or tremors. Pertinent negatives for diabetes include no chest pain, no fatigue, no polydipsia and no polyuria. Diabetic complications include nephropathy (microalbuminuria). Current diabetic treatment includes oral agent (monotherapy) (metformin). She is compliant with treatment all of the time. Her breakfast blood glucose is taken between 6-7 am. Her breakfast blood glucose range is generally 110-130 mg/dl. An ACE inhibitor/angiotensin II receptor blocker is not being taken (had cough with lisinopril). Eye exam is current.  Hypertension This is a recurrent problem. The problem has been rapidly improving since onset. The problem is controlled. Pertinent negatives include no chest pain, headaches, palpitations or shortness of breath. Past treatments include lifestyle changes and ACE inhibitors (stopped lisinopril due to cough which is much improved).  Sinus Problem This is a recurrent problem. There has been no fever. Associated symptoms include congestion and sinus pressure. Pertinent negatives include no coughing, headaches or shortness of breath. Past treatments include nasal decongestants. The treatment provided mild relief.    Lab Results  Component Value Date   NA 142 06/09/2021   K 4.6 06/09/2021   CO2 22 06/09/2021   GLUCOSE 111 (H) 06/09/2021   BUN 13 06/09/2021   CREATININE 1.13 (H) 06/09/2021   CALCIUM 9.6 06/09/2021   EGFR 52 (L) 06/09/2021   GFRNONAA 53 01/23/2020   Lab Results  Component Value Date   CHOL 161 02/08/2021   HDL 43 02/08/2021   LDLCALC 92 02/08/2021   TRIG 148 02/08/2021   CHOLHDL 3.7 02/08/2021   Lab Results  Component Value Date   TSH 2.790  06/09/2021   Lab Results  Component Value Date   HGBA1C 6.4 (H) 06/09/2021   Lab Results  Component Value Date   WBC 10.6 06/09/2021   HGB 13.2 06/09/2021   HCT 38.5 06/09/2021   MCV 88 06/09/2021   PLT 368 06/09/2021   Lab Results  Component Value Date   ALT 11 06/09/2021   AST 15 06/09/2021   ALKPHOS 87 06/09/2021   BILITOT 0.6 06/09/2021   No results found for: "25OHVITD2", "25OHVITD3", "VD25OH"   Review of Systems  Constitutional:  Negative for appetite change, fatigue, fever and unexpected weight change.  HENT:  Positive for congestion, postnasal drip and sinus pressure. Negative for tinnitus and trouble swallowing.   Eyes:  Negative for visual disturbance.  Respiratory:  Negative for cough, chest tightness and shortness of breath.   Cardiovascular:  Negative for chest pain, palpitations and leg swelling.  Gastrointestinal:  Negative for abdominal pain.  Endocrine: Negative for polydipsia and polyuria.  Genitourinary:  Negative for dysuria and hematuria.  Musculoskeletal:  Negative for arthralgias.  Neurological:  Negative for tremors, numbness and headaches.  Psychiatric/Behavioral:  Negative for dysphoric mood.     Patient Active Problem List   Diagnosis Date Noted  . BMI 38.0-38.9,adult 06/09/2021  . Chronic cough 06/09/2021  . Hyperlipidemia associated with type 2 diabetes mellitus ([place]) 10/07/2020  . Acquired hypothyroidism 09/30/2020  . Asymptomatic hyperuricemia 09/30/2020  . Environmental and seasonal allergies 09/30/2020  . Diabetes mellitus with microalbuminuria ([place]) 09/30/2020  . Type II diabetes mellitus with complication ([place]) 40/98/1191  . Essential hypertension 09/23/2020  . GERD (gastroesophageal reflux disease) 09/23/2020  .  Osteoarthritis of both knees 09/23/2020    Allergies  Allergen Reactions  . Codeine Nausea Only    Past Surgical History:  Procedure Laterality Date  . ABDOMINAL HYSTERECTOMY  1991  . CHOLECYSTECTOMY, LAPAROSCOPIC   1980    Social History   Tobacco Use  . Smoking status: Never    Passive exposure: Never  . Smokeless tobacco: Never  Vaping Use  . Vaping Use: Never used  Substance Use Topics  . Alcohol use: Not Currently  . Drug use: Never     Medication list has been reviewed and updated.  Current Meds  Medication Sig  . allopurinol (ZYLOPRIM) 100 MG tablet TAKE 1 TABLET DAILY  . atorvastatin (LIPITOR) 10 MG tablet TAKE 1 TABLET DAILY  . celecoxib (CELEBREX) 200 MG capsule TAKE 1 CAPSULE DAILY  . metFORMIN (GLUCOPHAGE) 500 MG tablet TAKE 1 TABLET TWICE A DAY WITH MEALS  . Multiple Vitamin (MULTIVITAMIN ADULT PO) Take 1 capsule by mouth daily.  Marland Kitchen omeprazole (PRILOSEC) 40 MG capsule TAKE 1 CAPSULE DAILY  . oxymetazoline (AFRIN) 0.05 % nasal spray Place 1 spray into both nostrils 2 (two) times daily as needed for congestion.  Marland Kitchen SYNTHROID 50 MCG tablet TAKE 1 TABLET DAILY BEFORE BREAKFAST       10/25/2021   11:04 AM 06/09/2021   10:14 AM 02/08/2021    8:01 AM 09/30/2020    2:03 PM  GAD 7 : Generalized Anxiety Score  Nervous, Anxious, on Edge 0 0 0 0  Control/stop worrying 0 1 0 0  Worry too much - different things 1 1 0 0  Trouble relaxing 0 0 0 0  Restless 0 1 0 0  Easily annoyed or irritable 0 0 1 1  Afraid - awful might happen 0 0 0 0  Total GAD 7 Score _0 Anxiety Difficulty Not difficult at all  Not difficult at all Not difficult at all       10/25/2021   11:03 AM 06/09/2021   10:14 AM 05/05/2021    2:56 PM  Depression screen PHQ 2/9  Decreased Interest 1 0 0  Down, Depressed, Hopeless 1 0 0  PHQ - 2 Score 2 0 0  Altered sleeping 2 1   Tired, decreased energy 3 1   Change in appetite 2 0   Feeling bad or failure about yourself  0 0   Trouble concentrating 0 0   Moving slowly or fidgety/restless 0 0   Suicidal thoughts 0 0   PHQ-9 Score 9 2   Difficult doing work/chores Not difficult at all Not difficult at all     BP Readings from Last 3 Encounters:  10/25/21  128/72  06/09/21 122/84  02/08/21 136/78    Physical Exam Vitals and nursing note reviewed.  Constitutional:      General: She is not in acute distress.    Appearance: Normal appearance. She is well-developed.  HENT:     Head: Normocephalic and atraumatic.     Right Ear: Tympanic membrane normal.     Left Ear: Tympanic membrane normal.     Nose:     Right Sinus: Maxillary sinus tenderness present.  Cardiovascular:     Rate and Rhythm: Normal rate and regular rhythm.  Pulmonary:     Effort: Pulmonary effort is normal. No respiratory distress.     Breath sounds: No wheezing or rhonchi.  Musculoskeletal:     Cervical back: Normal range of motion.     Right lower  leg: No edema.     Left lower leg: No edema.  Lymphadenopathy:     Cervical: No cervical adenopathy.  Skin:    General: Skin is warm and dry.     Capillary Refill: Capillary refill takes less than 2 seconds.     Findings: No rash.  Neurological:     General: No focal deficit present.     Mental Status: She is alert and oriented to person, place, and time.  Psychiatric:        Mood and Affect: Mood normal.        Behavior: Behavior normal.    Wt Readings from Last 3 Encounters:  10/25/21 213 lb (96.6 kg)  06/09/21 211 lb 6.4 oz (95.9 kg)  02/08/21 211 lb 12.8 oz (96.1 kg)    BP 128/72   Pulse 62   Ht 5' 2" (1.575 m)   Wt 213 lb (96.6 kg)   SpO2 95%   BMI 38.96 kg/m   Assessment and Plan: 1. Diabetes mellitus with microalbuminuria ([place]) Recommend ARB since ACE intolerant Call if cough worsens - losartan (COZAAR) 25 MG tablet; Take 1 tablet (25 mg total) by mouth daily.  Dispense: 90 tablet; Refill: 3  2. Type II diabetes mellitus with complication (HCC) Clinically stable by exam and report without s/s of hypoglycemia. DM complicated by hypertension and dyslipidemia. Tolerating medications well without side effects or other concerns. - Hemoglobin A1c  3. Stage 3a chronic kidney disease  (HCC) Continue to monitor - avoid NSAIDS - Basic metabolic panel  4. Nasal sinus congestion Has not responded to nasal steroids, antihistamines, etc so will refer to ENT for further evaluation - Ambulatory referral to ENT   Partially dictated using Dragon software. Any errors are unintentional.  Halina Maidens, MD [facility]  10/25/2021

## 2021-10-26 DIAGNOSIS — E118 Type 2 diabetes mellitus with unspecified complications: Secondary | ICD-10-CM | POA: Diagnosis not present

## 2021-10-26 DIAGNOSIS — N1831 Chronic kidney disease, stage 3a: Secondary | ICD-10-CM | POA: Diagnosis not present

## 2021-10-27 LAB — HEMOGLOBIN A1C
Est. average glucose Bld gHb Est-mCnc: 131 mg/dL
Hgb A1c MFr Bld: 6.2 % — ABNORMAL HIGH (ref 4.8–5.6)

## 2021-10-27 LAB — BASIC METABOLIC PANEL
BUN/Creatinine Ratio: 13 (ref 12–28)
BUN: 14 mg/dL (ref 8–27)
CO2: 23 mmol/L (ref 20–29)
Calcium: 9.5 mg/dL (ref 8.7–10.3)
Chloride: 103 mmol/L (ref 96–106)
Creatinine, Ser: 1.05 mg/dL — ABNORMAL HIGH (ref 0.57–1.00)
Glucose: 111 mg/dL — ABNORMAL HIGH (ref 70–99)
Potassium: 4.8 mmol/L (ref 3.5–5.2)
Sodium: 139 mmol/L (ref 134–144)
eGFR: 56 mL/min/{1.73_m2} — ABNORMAL LOW (ref >59)

## 2021-11-18 DIAGNOSIS — J329 Chronic sinusitis, unspecified: Secondary | ICD-10-CM | POA: Diagnosis not present

## 2021-11-18 DIAGNOSIS — J342 Deviated nasal septum: Secondary | ICD-10-CM | POA: Diagnosis not present

## 2021-11-18 DIAGNOSIS — J310 Chronic rhinitis: Secondary | ICD-10-CM | POA: Diagnosis not present

## 2021-12-10 ENCOUNTER — Other Ambulatory Visit: Payer: Self-pay | Admitting: Internal Medicine

## 2021-12-10 DIAGNOSIS — E118 Type 2 diabetes mellitus with unspecified complications: Secondary | ICD-10-CM

## 2021-12-10 NOTE — Telephone Encounter (Signed)
Requested Prescriptions  Pending Prescriptions Disp Refills  . metFORMIN (GLUCOPHAGE) 500 MG tablet [Pharmacy Med Name: METFORMIN HCL TABS $Remove'500MG'[id number]$ ] 180 tablet 0    Sig: TAKE 1 TABLET TWICE A DAY WITH MEALS     Endocrinology:  Diabetes - Biguanides Failed - 12/10/2021  3:30 AM      Failed - Cr in normal range and within 360 days    Creatinine, Ser  Date Value Ref Range Status  10/26/2021 1.05 (H) 0.57 - 1.00 mg/dL Final         Failed - eGFR in normal range and within 360 days    GFR calc Af Amer  Date Value Ref Range Status  01/23/2020 62  Final   GFR calc non Af Amer  Date Value Ref Range Status  01/23/2020 53  Final   eGFR  Date Value Ref Range Status  10/26/2021 56 (L) >59 mL/min/1.73 Final         Failed - B12 Level in normal range and within 720 days    No results found for: "VITAMINB12"       Passed - HBA1C is between 0 and 7.9 and within 180 days    Hemoglobin A1C  Date Value Ref Range Status  01/23/2020 6.4  Final   Hgb A1c MFr Bld  Date Value Ref Range Status  10/26/2021 6.2 (H) 4.8 - 5.6 % Final    Comment:             Prediabetes: 5.7 - 6.4          Diabetes: >6.4          Glycemic control for adults with diabetes: <7.0          Passed - Valid encounter within last 6 months    Recent Outpatient Visits          1 month ago Diabetes mellitus with microalbuminuria ([place])   [place] [facility] at [facility], Jesse Sans, MD   6 months ago Annual physical exam   [place] [facility] at [facility], Jesse Sans, MD   10 months ago Type II diabetes mellitus with complication [facility])   [place] [facility] at [facility], Jesse Sans, MD   1 year ago Gastroesophageal reflux disease, unspecified whether esophagitis present   [facility] at [facility], Jesse Sans, MD      Future Appointments            In 2 months  Glean Hess, MD [facility] Primary [facility] and Sports Medicine at [facility], [place] within normal limits and completed in the last 12 months    WBC  Date Value Ref Range Status  06/09/2021 10.6 3.4 - 10.8 x10E3/uL Final   RBC  Date Value Ref Range Status  06/09/2021 4.40 3.77 - 5.28 x10E6/uL Final  07/22/2019 4.67 3.87 - 5.11 Final   Hemoglobin  Date Value Ref Range Status  06/09/2021 13.2 11.1 - 15.9 g/dL Final   Hematocrit  Date Value Ref Range Status  06/09/2021 38.5 34.0 - 46.6 % Final   MCHC  Date Value Ref Range Status  06/09/2021 34.3 31.5 - 35.7 g/dL Final   [facility]  Date Value Ref Range Status  06/09/2021 30.0 26.6 - 33.0 pg Final   MCV  Date Value Ref Range Status  06/09/2021 88 79 - 97 fL Final   No results found for: "PLTCOUNTKUC", "LABPLAT", "POCPLA" RDW  Date Value Ref Range Status  06/09/2021 13.4 11.7 - 15.4 % Final

## 2021-12-16 ENCOUNTER — Encounter: Payer: Self-pay | Admitting: Otolaryngology

## 2021-12-16 DIAGNOSIS — R682 Dry mouth, unspecified: Secondary | ICD-10-CM | POA: Diagnosis not present

## 2021-12-16 DIAGNOSIS — J329 Chronic sinusitis, unspecified: Secondary | ICD-10-CM | POA: Diagnosis not present

## 2021-12-29 DIAGNOSIS — E119 Type 2 diabetes mellitus without complications: Secondary | ICD-10-CM | POA: Diagnosis not present

## 2021-12-29 DIAGNOSIS — H04123 Dry eye syndrome of bilateral lacrimal glands: Secondary | ICD-10-CM | POA: Diagnosis not present

## 2021-12-29 DIAGNOSIS — H2513 Age-related nuclear cataract, bilateral: Secondary | ICD-10-CM | POA: Diagnosis not present

## 2021-12-29 DIAGNOSIS — H1045 Other chronic allergic conjunctivitis: Secondary | ICD-10-CM | POA: Diagnosis not present

## 2021-12-29 DIAGNOSIS — H01005 Unspecified blepharitis left lower eyelid: Secondary | ICD-10-CM | POA: Diagnosis not present

## 2022-01-05 ENCOUNTER — Other Ambulatory Visit: Payer: Self-pay | Admitting: Internal Medicine

## 2022-01-05 DIAGNOSIS — K219 Gastro-esophageal reflux disease without esophagitis: Secondary | ICD-10-CM

## 2022-01-06 NOTE — Telephone Encounter (Signed)
Requested Prescriptions  Pending Prescriptions Disp Refills  . omeprazole (PRILOSEC) 40 MG capsule [Pharmacy Med Name: OMEPRAZOLE DR CAPS 40MG ] 90 capsule 3    Sig: TAKE 1 CAPSULE DAILY     Gastroenterology: Proton Pump Inhibitors Passed - 01/05/2022  1:21 PM      Passed - Valid encounter within last 12 months    Recent Outpatient Visits          2 months ago Diabetes mellitus with microalbuminuria ([place])   [place] [facility] at [facility], Jesse Sans, MD   7 months ago Annual physical exam   [place] [facility] at [facility], Jesse Sans, MD   11 months ago Type II diabetes mellitus with complication [facility])   [place] [facility] and Sports Medicine at [facility], Jesse Sans, MD   1 year ago Gastroesophageal reflux disease, unspecified whether esophagitis present   [facility] and [facility] Medicine at [facility], Jesse Sans, MD      Future Appointments            In 1 month Army Melia, Jesse Sans, MD [place] [facility] and Sports Medicine at [facility], [employer]

## 2022-02-25 ENCOUNTER — Ambulatory Visit (INDEPENDENT_AMBULATORY_CARE_PROVIDER_SITE_OTHER): Payer: Medicare PPO | Admitting: Internal Medicine

## 2022-02-25 ENCOUNTER — Other Ambulatory Visit: Payer: Self-pay | Admitting: Internal Medicine

## 2022-02-25 ENCOUNTER — Encounter: Payer: Self-pay | Admitting: Internal Medicine

## 2022-02-25 VITALS — BP 120/76 | HR 64 | Ht 62.0 in | Wt 209.0 lb

## 2022-02-25 DIAGNOSIS — K219 Gastro-esophageal reflux disease without esophagitis: Secondary | ICD-10-CM

## 2022-02-25 DIAGNOSIS — E118 Type 2 diabetes mellitus with unspecified complications: Secondary | ICD-10-CM

## 2022-02-25 DIAGNOSIS — E785 Hyperlipidemia, unspecified: Secondary | ICD-10-CM | POA: Diagnosis not present

## 2022-02-25 DIAGNOSIS — R053 Chronic cough: Secondary | ICD-10-CM | POA: Diagnosis not present

## 2022-02-25 DIAGNOSIS — E1169 Type 2 diabetes mellitus with other specified complication: Secondary | ICD-10-CM | POA: Diagnosis not present

## 2022-02-25 DIAGNOSIS — R0602 Shortness of breath: Secondary | ICD-10-CM | POA: Diagnosis not present

## 2022-02-25 DIAGNOSIS — I10 Essential (primary) hypertension: Secondary | ICD-10-CM

## 2022-02-25 DIAGNOSIS — Z1231 Encounter for screening mammogram for malignant neoplasm of breast: Secondary | ICD-10-CM

## 2022-02-25 LAB — POCT GLYCOSYLATED HEMOGLOBIN (HGB A1C): Hemoglobin A1C: 6 % — AB (ref 4.0–5.6)

## 2022-02-25 MED ORDER — ATORVASTATIN CALCIUM 10 MG PO TABS: 10.0000 mg | ORAL_TABLET | Freq: Every day | ORAL | 1 refills | Status: DC

## 2022-02-25 MED ORDER — OMEPRAZOLE 40 MG PO CPDR: 40.0000 mg | DELAYED_RELEASE_CAPSULE | Freq: Every day | ORAL | 1 refills | Status: DC

## 2022-02-25 MED ORDER — METFORMIN HCL 500 MG PO TABS: 500.0000 mg | ORAL_TABLET | Freq: Two times a day (BID) | ORAL | 1 refills | Status: DC

## 2022-02-25 NOTE — Progress Notes (Signed)
Date:  02/25/2022   Name:  Alexandria Franklin   DOB:  1950-08-19   MRN:  [id number]   Chief Complaint: Diabetes and Hypertension  Diabetes She presents for her follow-up diabetic visit. She has type 2 diabetes mellitus. Her disease course has been stable. Pertinent negatives for hypoglycemia include no headaches or tremors. Pertinent negatives for diabetes include no chest pain, no fatigue, no polydipsia and no polyuria. Current diabetic treatments: metformin. Her weight is stable. She monitors blood glucose at home 1-2 x per week. Her breakfast blood glucose is taken between 7-8 am. Her breakfast blood glucose range is generally 110-130 mg/dl. An ACE inhibitor/angiotensin II receptor blocker is being taken. Eye exam is current.  Hypertension This is a chronic problem. The problem is controlled. Pertinent negatives include no chest pain, headaches, palpitations or shortness of breath. Past treatments include angiotensin blockers.  Cough This is a chronic problem. The problem has been unchanged. The problem occurs every few minutes. The cough is Non-productive. Associated symptoms include nasal congestion, postnasal drip and wheezing. Pertinent negatives include no chest pain, fever, headaches or shortness of breath. The symptoms are aggravated by exercise. Treatments tried: flonase and astelin. The treatment provided mild relief. Her past medical history is significant for environmental allergies. There is no history of asthma or COPD.    Lab Results  Component Value Date   NA 139 10/26/2021   K 4.8 10/26/2021   CO2 23 10/26/2021   GLUCOSE 111 (H) 10/26/2021   BUN 14 10/26/2021   CREATININE 1.05 (H) 10/26/2021   CALCIUM 9.5 10/26/2021   EGFR 56 (L) 10/26/2021   GFRNONAA 53 01/23/2020   Lab Results  Component Value Date   CHOL 161 02/08/2021   HDL 43 02/08/2021   LDLCALC 92 02/08/2021   TRIG 148 02/08/2021   CHOLHDL 3.7 02/08/2021   Lab Results  Component Value Date   TSH 2.790  06/09/2021   Lab Results  Component Value Date   HGBA1C 6.2 (H) 10/26/2021   Lab Results  Component Value Date   WBC 10.6 06/09/2021   HGB 13.2 06/09/2021   HCT 38.5 06/09/2021   MCV 88 06/09/2021   PLT 368 06/09/2021   Lab Results  Component Value Date   ALT 11 06/09/2021   AST 15 06/09/2021   ALKPHOS 87 06/09/2021   BILITOT 0.6 06/09/2021   No results found for: "25OHVITD2", "25OHVITD3", "VD25OH"   Review of Systems  Constitutional:  Negative for appetite change, fatigue, fever and unexpected weight change.  HENT:  Positive for postnasal drip. Negative for tinnitus and trouble swallowing.   Eyes:  Negative for visual disturbance.  Respiratory:  Positive for cough and wheezing. Negative for chest tightness and shortness of breath.   Cardiovascular:  Negative for chest pain, palpitations and leg swelling.  Gastrointestinal:  Negative for abdominal pain.  Endocrine: Negative for polydipsia and polyuria.  Genitourinary:  Negative for dysuria and hematuria.  Musculoskeletal:  Negative for arthralgias.  Allergic/Immunologic: Positive for environmental allergies.  Neurological:  Negative for tremors, numbness and headaches.  Psychiatric/Behavioral:  Negative for dysphoric mood.     Patient Active Problem List   Diagnosis Date Noted   Stage 3a chronic kidney disease ([place]) 10/25/2021   BMI 38.0-38.9,adult 06/09/2021   Chronic cough 06/09/2021   Hyperlipidemia associated with type 2 diabetes mellitus ([place]) 10/07/2020   Acquired hypothyroidism 09/30/2020   Asymptomatic hyperuricemia 09/30/2020   Environmental and seasonal allergies 09/30/2020   Diabetes mellitus with microalbuminuria ([place]) 09/30/2020  Type II diabetes mellitus with complication (HCC) 93/79/0240   Essential hypertension 09/23/2020   GERD (gastroesophageal reflux disease) 09/23/2020   Osteoarthritis of both knees 09/23/2020    Allergies  Allergen Reactions   Lisinopril Cough   Codeine Nausea Only     Past Surgical History:  Procedure Laterality Date   ABDOMINAL HYSTERECTOMY  1991   CHOLECYSTECTOMY, LAPAROSCOPIC  1980    Social History   Tobacco Use   Smoking status: Never    Passive exposure: Never   Smokeless tobacco: Never  Vaping Use   Vaping Use: Never used  Substance Use Topics   Alcohol use: Not Currently   Drug use: Never     Medication list has been reviewed and updated.  Current Meds  Medication Sig   allopurinol (ZYLOPRIM) 100 MG tablet TAKE 1 TABLET DAILY   atorvastatin (LIPITOR) 10 MG tablet TAKE 1 TABLET DAILY   azelastine (ASTELIN) 0.1 % nasal spray    celecoxib (CELEBREX) 200 MG capsule TAKE 1 CAPSULE DAILY   fluticasone (FLONASE) 50 MCG/ACT nasal spray    losartan (COZAAR) 25 MG tablet Take 1 tablet (25 mg total) by mouth daily.   metFORMIN (GLUCOPHAGE) 500 MG tablet TAKE 1 TABLET TWICE A DAY WITH MEALS   Multiple Vitamin (MULTIVITAMIN ADULT PO) Take 1 capsule by mouth daily.   omeprazole (PRILOSEC) 40 MG capsule TAKE 1 CAPSULE DAILY   SYNTHROID 50 MCG tablet TAKE 1 TABLET DAILY BEFORE BREAKFAST       02/25/2022    9:10 AM 10/25/2021   11:04 AM 06/09/2021   10:14 AM 02/08/2021    8:01 AM  GAD 7 : Generalized Anxiety Score  Nervous, Anxious, on Edge 0 0 0 0  Control/stop worrying 0 0 1 0  Worry too much - different things _0 0  Trouble relaxing 0 0 0 0  Restless 0 0 1 0  Easily annoyed or irritable 0 0 0 1  Afraid - awful might happen 0 0 0 0  Total GAD 7 Score _1 Anxiety Difficulty Not difficult at all Not difficult at all  Not difficult at all       02/25/2022    9:10 AM 10/25/2021   11:03 AM 06/09/2021   10:14 AM  Depression screen PHQ 2/9  Decreased Interest 1 1 0  Down, Depressed, Hopeless 1 1 0  PHQ - 2 Score 2 2 0  Altered sleeping _2 Tired, decreased energy _3 Change in appetite 0 2 0  Feeling bad or failure about yourself  0 0 0  Trouble concentrating 0 0 0  Moving slowly or fidgety/restless 0 0 0   Suicidal thoughts 0 0 0  PHQ-9 Score _4 Difficult doing work/chores Not difficult at all Not difficult at all Not difficult at all    BP Readings from Last 3 Encounters:  02/25/22 120/76  10/25/21 128/72  06/09/21 122/84    Physical Exam Vitals and nursing note reviewed.  Constitutional:      General: She is not in acute distress.    Appearance: She is well-developed.  HENT:     Head: Normocephalic and atraumatic.  Cardiovascular:     Rate and Rhythm: Normal rate and regular rhythm.     Pulses: Normal pulses.  Pulmonary:     Effort: Pulmonary effort is normal. No respiratory distress.     Comments: Whistling/wheezing on expiration throughout all lung fields Musculoskeletal:  Cervical back: Normal range of motion.  Lymphadenopathy:     Cervical: No cervical adenopathy.  Skin:    General: Skin is warm and dry.     Findings: No rash.  Neurological:     Mental Status: She is alert and oriented to person, place, and time.  Psychiatric:        Mood and Affect: Mood normal.        Behavior: Behavior normal.     Wt Readings from Last 3 Encounters:  02/25/22 209 lb (94.8 kg)  10/25/21 213 lb (96.6 kg)  06/09/21 211 lb 6.4 oz (95.9 kg)    BP 120/76   Pulse 64   Ht _0  (1.575 m)   Wt 209 lb (94.8 kg)   SpO2 95%   BMI 38.23 kg/m   Assessment and Plan: 1. Type II diabetes mellitus with complication (HCC) Clinically stable by exam and report without s/s of hypoglycemia. DM complicated by hypertension and dyslipidemia. Tolerating medications well without side effects or other concerns. - POCT glycosylated hemoglobin (Hb A1C) - metFORMIN (GLUCOPHAGE) 500 MG tablet; Take 1 tablet (500 mg total) by mouth 2 (two) times daily with a meal.  Dispense: 180 tablet; Refill: 1  2. Essential hypertension Clinically stable exam with well controlled BP. Tolerating medications without side effects at this time. Pt to continue current regimen and low sodium diet; benefits  of regular exercise as able discussed.  3. Shortness of breath No benefit from ENT evaluation and nasal sprays Recommend Pulmonary evaluation - Ambulatory referral to Pulmonology  4. Chronic cough - Ambulatory referral to Pulmonology  5. Gastroesophageal reflux disease, unspecified whether esophagitis present Continue daily PPI to reduce reflux related symptoms  - omeprazole (PRILOSEC) 40 MG capsule; Take 1 capsule (40 mg total) by mouth daily.  Dispense: 90 capsule; Refill: 1  6. Hyperlipidemia associated with type 2 diabetes mellitus (HCC) - atorvastatin (LIPITOR) 10 MG tablet; Take 1 tablet (10 mg total) by mouth daily.  Dispense: 90 tablet; Refill: 1   Partially dictated using Editor, commissioning. Any errors are unintentional.  Halina Maidens, MD [facility]  02/25/2022

## 2022-03-15 ENCOUNTER — Other Ambulatory Visit: Payer: Self-pay | Admitting: Internal Medicine

## 2022-03-15 DIAGNOSIS — E039 Hypothyroidism, unspecified: Secondary | ICD-10-CM

## 2022-03-15 NOTE — Telephone Encounter (Signed)
Medication Refill - Medication: SYNTHROID 50 MCG tablet   Has the patient contacted their pharmacy? No.  Preferred Pharmacy (with phone number or street name):  [id number] - [place], [place] [address] Phone: [phone]  Fax: [phone]     Has the patient been seen for an appointment in the last year OR does the patient have an upcoming appointment? Yes.    Please assist patient further as she is down to her last week of medicine left.

## 2022-03-16 MED ORDER — LEVOTHYROXINE SODIUM 50 MCG PO TABS: 50.0000 ug | ORAL_TABLET | Freq: Every day | ORAL | 0 refills | Status: DC

## 2022-03-16 NOTE — Telephone Encounter (Signed)
Pt has 1 RF remaining at [place] but pt requesting at local pharmacy.   Requested Prescriptions  Pending Prescriptions Disp Refills   levothyroxine (SYNTHROID) 50 MCG tablet 90 tablet 2    Sig: Take 1 tablet (50 mcg total) by mouth daily before breakfast.     Endocrinology:  Hypothyroid Agents Passed - 03/15/2022  4:43 PM      Passed - TSH in normal range and within 360 days    TSH  Date Value Ref Range Status  06/09/2021 2.790 0.450 - 4.500 uIU/mL Final         Passed - Valid encounter within last 12 months    Recent Outpatient Visits           2 weeks ago Type II diabetes mellitus with complication [facility])   [place] [facility] at [facility], Jesse Sans, MD   4 months ago Diabetes mellitus with microalbuminuria [facility])   [place] [facility] at [facility], Jesse Sans, MD   9 months ago Annual physical exam   [place] [facility] Medicine at [facility], Jesse Sans, MD   1 year ago Type II diabetes mellitus with complication [facility])   [place] [facility] Medicine at [facility], Jesse Sans, MD   1 year ago Gastroesophageal reflux disease, unspecified whether esophagitis present   [facility] at [facility], Jesse Sans, MD       Future Appointments             In 3 months Army Melia, Jesse Sans, MD [place] and Sports Medicine at [facility]

## 2022-03-28 ENCOUNTER — Ambulatory Visit
Admission: RE | Admit: 2022-03-28 | Discharge: 2022-03-28 | Disposition: A | Discharge status: Home / Self Care | Payer: Medicare PPO | Source: Ambulatory Visit | Attending: Student in an Organized Health Care Education/Training Program | Admitting: Student in an Organized Health Care Education/Training Program

## 2022-03-28 ENCOUNTER — Encounter: Payer: Self-pay | Admitting: Student in an Organized Health Care Education/Training Program

## 2022-03-28 ENCOUNTER — Ambulatory Visit (INDEPENDENT_AMBULATORY_CARE_PROVIDER_SITE_OTHER): Payer: Medicare PPO | Admitting: Student in an Organized Health Care Education/Training Program

## 2022-03-28 VITALS — BP 122/74 | HR 71 | Temp 97.9°F | Ht 62.0 in | Wt 207.8 lb

## 2022-03-28 DIAGNOSIS — R0602 Shortness of breath: Secondary | ICD-10-CM | POA: Insufficient documentation

## 2022-03-28 DIAGNOSIS — R053 Chronic cough: Secondary | ICD-10-CM | POA: Insufficient documentation

## 2022-03-28 DIAGNOSIS — R059 Cough, unspecified: Secondary | ICD-10-CM | POA: Diagnosis not present

## 2022-03-28 MED ORDER — BUDESONIDE-FORMOTEROL FUMARATE 80-4.5 MCG/ACT IN AERO: 2.0000 | INHALATION_SPRAY | RESPIRATORY_TRACT | 12 refills | Status: DC | PRN

## 2022-03-28 MED ORDER — DESLORATADINE 5 MG PO TABS: 5.0000 mg | ORAL_TABLET | Freq: Every day | ORAL | 11 refills | Status: DC

## 2022-03-28 NOTE — Patient Instructions (Addendum)
We are evaluating you for a chronic cough that to me is concerning for asthma. I have ordered a breathing test that you will need to schedule in our office. I have also ordered a chest xray for you to get done today. Furthermore, I have sent in a prescription for an allergy pill (take once daily) and an inhaler (use every 4 hours as needed, wash your mouth after every use). I have also ordered swallowing study to assess you for reflux.  Finally, I ordered blood work. You can get them draw at your preferred [facility] draw station. The nearest one to [facility] is at nearby [place] ([place], [place], [place] [address]).

## 2022-03-28 NOTE — Progress Notes (Signed)
Synopsis: Referred in for cough and shortness of breath by Reubin Milan, MD  Assessment & Plan:   1. Shortness of breath 2. Chronic cough  The patient is presenting for the evaluation of shortness of breath, chronic cough, and wheezing.  She does have CBC with differential showing an eosinophil count of 500 and her medical record.  The overall picture is concerning for possible reactive airway disease such as asthma.  I will initiate a workup with a pulmonary function test (spirometry, lung volumes, DLCO), repeat CBC w/ differential, ANCA profile, chest x-ray, and an allergen panel (evaluate T-helper cell response). I will further workup her cough by obtaining a double contrast esophagogram to assess for reflux.  For management, I will initiate her on a second-generation antihistamine to aid with her symptoms of rhinitis as well as start Symbicort 2 puffs every 4 hours as needed for her cough/wheeze.  Should the pulmonary function test show notable obstruction and/or patient's symptoms improve with the Symbicort, I will consider switching her to a twice daily regimen as a maintenance inhaler.  - DG Chest 2 View; Future - Pulmonary Function Test [facility] Only; Future - DG ESOPHAGUS W DOUBLE CM (HD); Future - ANCA Profile - CBC with Differential/Platelet - Allergen Panel (27) + IGE - budesonide-formoterol (SYMBICORT) 80-4.5 MCG/ACT inhaler; Inhale 2 puffs into the lungs every 4 (four) hours as needed.  Dispense: 1 each; Refill: 12 - desloratadine (CLARINEX) 5 MG tablet; Take 1 tablet (5 mg total) by mouth daily.  Dispense: 30 tablet; Refill: 11  Return in about 3 months (around 06/27/2022).  I spent 60 minutes caring for this patient today, including preparing to see the patient, obtaining a medical history , reviewing a separately obtained history, performing a medically appropriate examination and/or evaluation, counseling and educating the patient/family/caregiver, ordering medications,  tests, or procedures, and documenting clinical information in the electronic health record  Raechel Chute, MD [facility] 03/28/2022 4:03 PM    End of visit medications:  Meds ordered this encounter  Medications   budesonide-formoterol (SYMBICORT) 80-4.5 MCG/ACT inhaler    Sig: Inhale 2 puffs into the lungs every 4 (four) hours as needed.    Dispense:  1 each    Refill:  12   desloratadine (CLARINEX) 5 MG tablet    Sig: Take 1 tablet (5 mg total) by mouth daily.    Dispense:  30 tablet    Refill:  11     Current Outpatient Medications:    allopurinol (ZYLOPRIM) 100 MG tablet, TAKE 1 TABLET DAILY, Disp: 90 tablet, Rfl: 2   atorvastatin (LIPITOR) 10 MG tablet, Take 1 tablet (10 mg total) by mouth daily., Disp: 90 tablet, Rfl: 1   azelastine (ASTELIN) 0.1 % nasal spray, , Disp: , Rfl:    budesonide-formoterol (SYMBICORT) 80-4.5 MCG/ACT inhaler, Inhale 2 puffs into the lungs every 4 (four) hours as needed., Disp: 1 each, Rfl: 12   celecoxib (CELEBREX) 200 MG capsule, TAKE 1 CAPSULE DAILY, Disp: 90 capsule, Rfl: 2   desloratadine (CLARINEX) 5 MG tablet, Take 1 tablet (5 mg total) by mouth daily., Disp: 30 tablet, Rfl: 11   fluticasone (FLONASE) 50 MCG/ACT nasal spray, , Disp: , Rfl:    levothyroxine (SYNTHROID) 50 MCG tablet, Take 1 tablet (50 mcg total) by mouth daily before breakfast., Disp: 90 tablet, Rfl: 0   losartan (COZAAR) 25 MG tablet, Take 1 tablet (25 mg total) by mouth daily., Disp: 90 tablet, Rfl: 3   metFORMIN (GLUCOPHAGE)  500 MG tablet, Take 1 tablet (500 mg total) by mouth 2 (two) times daily with a meal., Disp: 180 tablet, Rfl: 1   Multiple Vitamin (MULTIVITAMIN ADULT PO), Take 1 capsule by mouth daily., Disp: , Rfl:    omeprazole (PRILOSEC) 40 MG capsule, Take 1 capsule (40 mg total) by mouth daily., Disp: 90 capsule, Rfl: 1   Subjective:   PATIENT ID: Alexandria Franklin GENDER: female DOB: 1950/08/17, MRN: [id number]  Chief Complaint  Patient  presents with   pulmonary consult    C/o prod cough with white sputum, wheezing and SOB with exertion.     HPI  72 year old female with history of hypertension and diabetes who presents to clinic evaluation cough and wheezing.  Patient reports suffering from nasal congestion for the past 2 years with intermittent wheezing and a chronic cough. She feels that the cough has been consistently present for the past year or 2 and got worse after she moved from [place].  She reports, on further questioning, that she has had allergies for a long period of time and that her allergy to pine pollen was worse when she lived in [place].  She reports that this wheezing is sporadic and that her shortness of breath is mostly exertional.  She does not feel short of breath at rest and was at her baseline state of health during our interview.  She does not recall any past instances where she has had shortness of breath or respiratory complaints.  She does not have any personal history of asthma or reactive airway disease and has never had to use an inhaler nor see a pulmonologist.  She gets very short of breath when she exerts herself or walks long distances.  Patient does report that she had significant reflux made worse when she lays flat on her side.  She describes Flonase in the past and she had been using it for her nasal congestion.  She was seen by her primary care physician where she was noted to be actively wheezing on lung exam for which she is referred to pulmonology. She has been taken off her ACEi and switched to an ARB.  Patient reports being a never smoker and denies any occupational exposures.  She does not currently have any pets.  Ancillary information including prior medications, full medical/surgical/family/social histories, and PFTs (when available) are listed below and have been reviewed.   Review of Systems  Constitutional:  Negative for chills, fever and weight loss.  Respiratory:  Positive  for cough, shortness of breath and wheezing. Negative for hemoptysis and sputum production.   Cardiovascular:  Negative for chest pain.  Skin:  Negative for itching and rash.     Objective:   Vitals:   03/28/22 1517  BP: 122/74  Pulse: 71  Temp: 97.9 F (36.6 C)  TempSrc: Temporal  SpO2: 96%  Weight: 207 lb 12.8 oz (94.3 kg)  Height: 5\' 2"  (1.575 m)   96% on RA  BMI Readings from Last 3 Encounters:  03/28/22 38.01 kg/m  02/25/22 38.23 kg/m  10/25/21 38.96 kg/m   Wt Readings from Last 3 Encounters:  03/28/22 207 lb 12.8 oz (94.3 kg)  02/25/22 209 lb (94.8 kg)  10/25/21 213 lb (96.6 kg)    Physical Exam Constitutional:      General: She is not in acute distress.    Appearance: Normal appearance. She is not ill-appearing.  HENT:     Head: Normocephalic.     Nose: Nose normal.  Mouth/Throat:     Mouth: Mucous membranes are moist.  Eyes:     Extraocular Movements: Extraocular movements intact.     Pupils: Pupils are equal, round, and reactive to light.  Cardiovascular:     Rate and Rhythm: Normal rate and regular rhythm.     Pulses: Normal pulses.     Heart sounds: Normal heart sounds.  Pulmonary:     Effort: Pulmonary effort is normal.     Breath sounds: Normal breath sounds. No wheezing or rales.  Abdominal:     General: Abdomen is flat.     Palpations: Abdomen is soft.  Musculoskeletal:        General: Normal range of motion.     Cervical back: Normal range of motion.  Skin:    General: Skin is warm.  Neurological:     General: No focal deficit present.     Mental Status: She is alert and oriented to person, place, and time. Mental status is at baseline.     Ancillary Information    Past Medical History:  Diagnosis Date   Allergy    Diabetes ([place])    Essential hypertension    GERD (gastroesophageal reflux disease)    Rheumatoid arthritis ([place])    Thyroid disease      Family History  Problem Relation Age of Onset   Cancer Mother     Thyroid disease Father    Diabetes Father    Mental illness Sister    Breast cancer Neg Hx      Past Surgical History:  Procedure Laterality Date   ABDOMINAL HYSTERECTOMY  1991   CHOLECYSTECTOMY, LAPAROSCOPIC  1980    Social History   Socioeconomic History   Marital status: Widowed    Spouse name: Not on file   Number of children: 1   Years of education: 14   Highest education level: Associate degree: occupational, Hotel manager, or vocational program  Occupational History   Not on file  Tobacco Use   Smoking status: Never    Passive exposure: Never   Smokeless tobacco: Never  Vaping Use   Vaping Use: Never used  Substance and Sexual Activity   Alcohol use: Not Currently   Drug use: Never   Sexual activity: Not Currently  Other Topics Concern   Not on file  Social History Narrative   Pt lives alone    Social Determinants of Health   Financial Resource Strain: Low Risk  (02/25/2022)   Overall Financial Resource Strain (CARDIA)    Difficulty of Paying Living Expenses: Not hard at all  Food Insecurity: No Food Insecurity (02/25/2022)   Hunger Vital Sign    Worried About Running Out of Food in the Last Year: Never true    [place] in the Last Year: Never true  Transportation Needs: No Transportation Needs (02/25/2022)   PRAPARE - Hydrologist (Medical): No    Lack of Transportation (Non-Medical): No  Physical Activity: Inactive (05/05/2021)   Exercise Vital Sign    Days of Exercise per Week: 0 days    Minutes of Exercise per Session: 0 min  Stress: No Stress Concern Present (05/05/2021)   [place]    Feeling of Stress : Only a little  Social Connections: Moderately Integrated (05/05/2021)   Social Connection and Isolation Panel [NHANES]    Frequency of Communication with Friends and Family: More than three times a week    Frequency  of Social Gatherings with Friends  and Family: Once a week    Attends Religious Services: More than 4 times per year    Active Member of [employer] or Organizations: Yes    Attends Archivist Meetings: 1 to 4 times per year    Marital Status: Widowed  Intimate Partner Violence: Not At Risk (02/25/2022)   Humiliation, Afraid, Rape, and Kick questionnaire    Fear of Current or Ex-Partner: No    Emotionally Abused: No    Physically Abused: No    Sexually Abused: No     Allergies  Allergen Reactions   Lisinopril Cough   Codeine Nausea Only     CBC    Component Value Date/Time   WBC 10.6 06/09/2021 1041   RBC 4.40 06/09/2021 1041   RBC 4.67 07/22/2019 0000   HGB 13.2 06/09/2021 1041   HCT 38.5 06/09/2021 1041   PLT 368 06/09/2021 1041   MCV 88 06/09/2021 1041   MCH 30.0 06/09/2021 1041   MCHC 34.3 06/09/2021 1041   RDW 13.4 06/09/2021 1041   LYMPHSABS 3.5 (H) 06/09/2021 1041   EOSABS 0.5 (H) 06/09/2021 1041   BASOSABS 0.1 06/09/2021 1041    Pulmonary Functions Testing Results:     No data to display          Outpatient Medications Prior to Visit  Medication Sig Dispense Refill   allopurinol (ZYLOPRIM) 100 MG tablet TAKE 1 TABLET DAILY 90 tablet 2   atorvastatin (LIPITOR) 10 MG tablet Take 1 tablet (10 mg total) by mouth daily. 90 tablet 1   azelastine (ASTELIN) 0.1 % nasal spray      celecoxib (CELEBREX) 200 MG capsule TAKE 1 CAPSULE DAILY 90 capsule 2   fluticasone (FLONASE) 50 MCG/ACT nasal spray      levothyroxine (SYNTHROID) 50 MCG tablet Take 1 tablet (50 mcg total) by mouth daily before breakfast. 90 tablet 0   losartan (COZAAR) 25 MG tablet Take 1 tablet (25 mg total) by mouth daily. 90 tablet 3   metFORMIN (GLUCOPHAGE) 500 MG tablet Take 1 tablet (500 mg total) by mouth 2 (two) times daily with a meal. 180 tablet 1   Multiple Vitamin (MULTIVITAMIN ADULT PO) Take 1 capsule by mouth daily.     omeprazole (PRILOSEC) 40 MG capsule Take 1 capsule (40 mg total) by mouth daily. 90 capsule 1    No facility-administered medications prior to visit.

## 2022-03-29 ENCOUNTER — Telehealth: Payer: Self-pay | Admitting: Student in an Organized Health Care Education/Training Program

## 2022-03-29 NOTE — Telephone Encounter (Signed)
Lm for patient.  

## 2022-03-29 NOTE — Telephone Encounter (Signed)
Patient is aware that labs have been ordered.  She will go to [facility] tomorrow.  Nothing further needed.

## 2022-03-30 ENCOUNTER — Other Ambulatory Visit: Payer: Self-pay | Admitting: Student in an Organized Health Care Education/Training Program

## 2022-03-30 DIAGNOSIS — R0602 Shortness of breath: Secondary | ICD-10-CM | POA: Diagnosis not present

## 2022-04-01 ENCOUNTER — Ambulatory Visit
Admission: RE | Admit: 2022-04-01 | Discharge: 2022-04-01 | Disposition: A | Discharge status: Home / Self Care | Payer: Medicare PPO | Source: Ambulatory Visit | Attending: Student in an Organized Health Care Education/Training Program | Admitting: Student in an Organized Health Care Education/Training Program

## 2022-04-01 ENCOUNTER — Other Ambulatory Visit: Payer: Self-pay | Admitting: Student in an Organized Health Care Education/Training Program

## 2022-04-01 DIAGNOSIS — R0602 Shortness of breath: Secondary | ICD-10-CM

## 2022-04-01 DIAGNOSIS — R053 Chronic cough: Secondary | ICD-10-CM | POA: Diagnosis not present

## 2022-04-01 DIAGNOSIS — K219 Gastro-esophageal reflux disease without esophagitis: Secondary | ICD-10-CM | POA: Diagnosis not present

## 2022-04-05 ENCOUNTER — Ambulatory Visit
Admission: EM | Admit: 2022-04-05 | Discharge: 2022-04-05 | Disposition: A | Discharge status: Home / Self Care | Payer: Medicare PPO | Attending: Emergency Medicine | Admitting: Emergency Medicine

## 2022-04-05 DIAGNOSIS — S61011A Laceration without foreign body of right thumb without damage to nail, initial encounter: Secondary | ICD-10-CM

## 2022-04-05 DIAGNOSIS — Z23 Encounter for immunization: Secondary | ICD-10-CM | POA: Diagnosis not present

## 2022-04-05 MED ORDER — TETANUS-DIPHTH-ACELL PERTUSSIS 5-2.5-18.5 LF-MCG/0.5 IM SUSY
0.5000 mL | PREFILLED_SYRINGE | Freq: Once | INTRAMUSCULAR | Status: AC
  Administered 2022-04-05: 0.5 mL via INTRAMUSCULAR

## 2022-04-05 NOTE — Discharge Instructions (Signed)
Keep this clean and dry for 48 to 72 hours.  Then keep it clean with soap and water.  Keep it covered during the day, do give it some dry time so that it can heal.  We will see you in 10 days for suture removal.  Come back sooner for any signs of infection.

## 2022-04-05 NOTE — ED Provider Notes (Signed)
HPI  SUBJECTIVE:  Alexandria Franklin is a right-handed 72 y.o. female who presents with a painful laceration to her right distal thumb pad sustained on a brand-new, clean can opener earlier today.  States that she tried irrigating it with water and applied bandages, then came here.  Symptoms worse with palpation.  Denies foreign body sensation, weakness, limitation motion of the thumb, numbness or tingling.  Patient has a past medical history of diabetes, hypertension, rheumatoid arthritis, hypothyroidism, chronic kidney disease stage III.  Her tetanus is not up-to-date.  PCP: [facility].  Past Medical History:  Diagnosis Date   Allergy    Diabetes ([place])    Essential hypertension    GERD (gastroesophageal reflux disease)    Rheumatoid arthritis ([place])    Thyroid disease     Past Surgical History:  Procedure Laterality Date   ABDOMINAL HYSTERECTOMY  1991   CHOLECYSTECTOMY, LAPAROSCOPIC  1980    Family History  Problem Relation Age of Onset   Cancer Mother    Thyroid disease Father    Diabetes Father    Mental illness Sister    Breast cancer Neg Hx     Social History   Tobacco Use   Smoking status: Never    Passive exposure: Never   Smokeless tobacco: Never  Vaping Use   Vaping Use: Never used  Substance Use Topics   Alcohol use: Not Currently   Drug use: Never    No current facility-administered medications for this encounter.  Current Outpatient Medications:    allopurinol (ZYLOPRIM) 100 MG tablet, TAKE 1 TABLET DAILY, Disp: 90 tablet, Rfl: 2   atorvastatin (LIPITOR) 10 MG tablet, Take 1 tablet (10 mg total) by mouth daily., Disp: 90 tablet, Rfl: 1   azelastine (ASTELIN) 0.1 % nasal spray, , Disp: , Rfl:    budesonide-formoterol (SYMBICORT) 80-4.5 MCG/ACT inhaler, Inhale 2 puffs into the lungs every 4 (four) hours as needed., Disp: 1 each, Rfl: 12   celecoxib (CELEBREX) 200 MG capsule, TAKE 1 CAPSULE DAILY, Disp: 90 capsule, Rfl: 2   desloratadine (CLARINEX) 5  MG tablet, Take 1 tablet (5 mg total) by mouth daily., Disp: 30 tablet, Rfl: 11   fluticasone (FLONASE) 50 MCG/ACT nasal spray, , Disp: , Rfl:    levothyroxine (SYNTHROID) 50 MCG tablet, Take 1 tablet (50 mcg total) by mouth daily before breakfast., Disp: 90 tablet, Rfl: 0   losartan (COZAAR) 25 MG tablet, Take 1 tablet (25 mg total) by mouth daily., Disp: 90 tablet, Rfl: 3   metFORMIN (GLUCOPHAGE) 500 MG tablet, Take 1 tablet (500 mg total) by mouth 2 (two) times daily with a meal., Disp: 180 tablet, Rfl: 1   Multiple Vitamin (MULTIVITAMIN ADULT PO), Take 1 capsule by mouth daily., Disp: , Rfl:    omeprazole (PRILOSEC) 40 MG capsule, Take 1 capsule (40 mg total) by mouth daily., Disp: 90 capsule, Rfl: 1  Allergies  Allergen Reactions   Lisinopril Cough   Codeine Nausea Only     ROS  As noted in HPI.   Physical Exam  BP (!) 149/87 (BP Location: Left Arm)   Pulse 71   Temp 98 F (36.7 C) (Oral)   Resp 16   Ht 5\' 2"  (1.575 m)   Wt 93.9 kg   SpO2 95%   BMI 37.86 kg/m   Constitutional: Well developed, well nourished, no acute distress Eyes:  EOMI, conjunctiva normal bilaterally HENT: Normocephalic, atraumatic,mucus membranes moist Respiratory: Normal inspiratory effort Cardiovascular: Normal rate GI: nondistended skin:  See MSK exam Musculoskeletal: 2 Centimeter clean linear laceration right thumb pad with no apparent foreign bodies noted.  2 point discrimination decreased near the laceration.  Flexion extension of the thumb intact   Neurologic: Alert & oriented x 3, no focal neuro deficits Psychiatric: Speech and behavior appropriate   ED Course   Medications  Tdap (BOOSTRIX) injection 0.5 mL (0.5 mLs Intramuscular Given 04/05/22 1633)    No orders of the defined types were placed in this encounter.   No results found for this or any previous visit (from the past 24 hour(s)). No results found.  ED Clinical Impression  1. Laceration of right thumb without  foreign body without damage to nail, initial encounter      ED Assessment/Plan      Procedure note: Cleaned base of the finger with alcohol and chlorhexidine.  Did a digital block with 1.5 cc of 1% plain lidocaine with adequate anesthesia.  Then extensively irrigated the wound with chlorhexidine and tap water.  Examined wound with adequate hemostasis, no foreign body, tendon or nerve involvement noted.  Using sterile technique, placed six 5-0 interrupted Ethilon sutures with close approximation of wound edges.  Dressing placed.  Patient tolerated procedure well.  Do not think that patient needs prophylactic antibiotics as this was sustained on a clean, new can opener and it was cleaned/irrigated extensively here.  Updated tetanus.  Home with Tylenol as needed.  Return here in 10 days for suture removal, sooner for any signs of infection.  Discussed  MDM, treatment plan, and plan for follow-up with patient. patient agrees with plan.   Meds ordered this encounter  Medications   Tdap (BOOSTRIX) injection 0.5 mL      *This clinic note was created using Lobby[employer]. Therefore, there may be occasional mistakes despite careful proofreading.  ?    Melynda Ripple, MD 04/06/22 1527

## 2022-04-05 NOTE — ED Triage Notes (Signed)
Pt presents to UC due to laceration in R thumb today due to her opening a can of tomatoes & lid slipped and cut her. Last tetanus about 16 yrs ago.

## 2022-04-09 LAB — ALLERGEN PANEL (27) + IGE
Alternaria Alternata IgE: <0.1 kU/L
Aspergillus Fumigatus IgE: <0.1 kU/L
Bahia Grass IgE: <0.1 kU/L
Bermuda Grass IgE: <0.1 kU/L
Cat Dander IgE: <0.1 kU/L
Cedar, Mountain IgE: <0.1 kU/L
Cladosporium Herbarum IgE: <0.1 kU/L
Cocklebur IgE: <0.1 kU/L
Cockroach, American IgE: <0.1 kU/L
Common Silver Birch IgE: <0.1 kU/L
D Farinae IgE: <0.1 kU/L
D Pteronyssinus IgE: <0.1 kU/L
Dog Dander IgE: <0.1 kU/L
Elm, American IgE: <0.1 kU/L
Hickory, White IgE: <0.1 kU/L
IgE (Immunoglobulin E), Serum: 35 IU/mL (ref 6–495)
Johnson Grass IgE: <0.1 kU/L
Kentucky Bluegrass IgE: <0.1 kU/L
Maple/Box Elder IgE: <0.1 kU/L
Mucor Racemosus IgE: <0.1 kU/L
Oak, White IgE: <0.1 kU/L
Penicillium Chrysogen IgE: <0.1 kU/L
Pigweed, Rough IgE: <0.1 kU/L
Plantain, English IgE: <0.1 kU/L
Ragweed, Short IgE: <0.1 kU/L
Setomelanomma Rostrat: <0.1 kU/L
Timothy Grass IgE: <0.1 kU/L
White Mulberry IgE: <0.1 kU/L

## 2022-04-09 LAB — ANCA PROFILE
Anti-MPO Antibodies: <0.2 units (ref 0.0–0.9)
Anti-PR3 Antibodies: <0.2 units (ref 0.0–0.9)
Atypical pANCA: <1:20 {titer}
C-ANCA: <1:20 {titer}
P-ANCA: <1:20 {titer}

## 2022-04-09 LAB — CBC WITH DIFFERENTIAL/PLATELET
Basophils Absolute: 0.1 10*3/uL (ref 0.0–0.2)
Basos: 1 %
EOS (ABSOLUTE): 0.5 10*3/uL — ABNORMAL HIGH (ref 0.0–0.4)
Eos: 4 %
Hematocrit: 40.7 % (ref 34.0–46.6)
Hemoglobin: 13.2 g/dL (ref 11.1–15.9)
Immature Grans (Abs): 0 10*3/uL (ref 0.0–0.1)
Immature Granulocytes: 0 %
Lymphocytes Absolute: 3.5 10*3/uL — ABNORMAL HIGH (ref 0.7–3.1)
Lymphs: 31 %
MCH: 29.7 pg (ref 26.6–33.0)
MCHC: 32.4 g/dL (ref 31.5–35.7)
MCV: 92 fL (ref 79–97)
Monocytes Absolute: 0.5 10*3/uL (ref 0.1–0.9)
Monocytes: 5 %
Neutrophils Absolute: 6.5 10*3/uL (ref 1.4–7.0)
Neutrophils: 59 %
Platelets: 424 10*3/uL (ref 150–450)
RBC: 4.45 x10E6/uL (ref 3.77–5.28)
RDW: 14 % (ref 11.7–15.4)
WBC: 11.2 10*3/uL — ABNORMAL HIGH (ref 3.4–10.8)

## 2022-04-11 ENCOUNTER — Ambulatory Visit
Admission: RE | Admit: 2022-04-11 | Discharge: 2022-04-11 | Disposition: A | Discharge status: Home / Self Care | Payer: Medicare PPO | Source: Ambulatory Visit | Attending: Internal Medicine | Admitting: Internal Medicine

## 2022-04-11 DIAGNOSIS — Z1231 Encounter for screening mammogram for malignant neoplasm of breast: Secondary | ICD-10-CM | POA: Insufficient documentation

## 2022-04-15 ENCOUNTER — Ambulatory Visit
Admission: RE | Admit: 2022-04-15 | Discharge: 2022-04-15 | Disposition: A | Discharge status: Home / Self Care | Payer: Medicare PPO | Source: Ambulatory Visit | Attending: Internal Medicine | Admitting: Internal Medicine

## 2022-04-15 DIAGNOSIS — S61011A Laceration without foreign body of right thumb without damage to nail, initial encounter: Secondary | ICD-10-CM

## 2022-04-15 NOTE — ED Triage Notes (Signed)
Pt presents to [facility] for suture removal. She had 6 sutures placed om 04/05/22 on her right thumb.

## 2022-04-15 NOTE — ED Notes (Signed)
Removed 6 sutures from pt right thumb. Pt tolerated procedure well.

## 2022-05-06 ENCOUNTER — Telehealth: Payer: Self-pay | Admitting: Student in an Organized Health Care Education/Training Program

## 2022-05-06 NOTE — Telephone Encounter (Signed)
Referral was placed on 04/04/2022.

## 2022-05-06 NOTE — Telephone Encounter (Signed)
Referral and notes have been faxed to [facility] Dr. Drinda Butts officer

## 2022-05-06 NOTE — Telephone Encounter (Signed)
Patient called to request that the doctor send a referral for a Gastro to Dr. Haig Prophet at the [facility].  Please advise and call patient to discuss further at [phone]

## 2022-05-09 ENCOUNTER — Ambulatory Visit: Payer: Medicare PPO

## 2022-05-10 NOTE — Telephone Encounter (Signed)
I spoke with Alexandria Franklin with [facility] they have this referral and patient has been scheduled. Patient will be seeing Dr. Olean Ree on 08/25/22

## 2022-05-11 ENCOUNTER — Ambulatory Visit (INDEPENDENT_AMBULATORY_CARE_PROVIDER_SITE_OTHER): Payer: Medicare PPO

## 2022-05-11 VITALS — Ht 62.0 in | Wt 207.0 lb

## 2022-05-11 DIAGNOSIS — Z0000 Encounter for general adult medical examination without abnormal findings: Secondary | ICD-10-CM

## 2022-05-11 NOTE — Progress Notes (Signed)
I connected with  Maye Alyzza Puck on 05/11/22 by a audio enabled telemedicine application and verified that I am speaking with the correct person using two identifiers.  Patient Location: Home  Provider Location: Office/Clinic  I discussed the limitations of evaluation and management by telemedicine. The patient expressed understanding and agreed to proceed.  Subjective:   Shanai Talithia Ercole is a 72 y.o. female who presents for Medicare Annual (Subsequent) preventive examination.  Review of Systems     Cardiac Risk Factors include: advanced age (>76mn, >>68women);diabetes mellitus;dyslipidemia;hypertension     Objective:    There were no vitals filed for this visit. There is no height or weight on file to calculate BMI.     05/11/2022    1:05 PM 05/05/2021    2:57 PM 02/08/2021    8:02 AM  Advanced Directives  Does Patient Have a Medical Advance Directive? No No No  Would patient like information on creating a medical advance directive? No - Patient declined No - Patient declined No - Patient declined    Current Medications (verified) Outpatient Encounter Medications as of 05/11/2022  Medication Sig   allopurinol (ZYLOPRIM) 100 MG tablet TAKE 1 TABLET DAILY   atorvastatin (LIPITOR) 10 MG tablet Take 1 tablet (10 mg total) by mouth daily.   azelastine (ASTELIN) 0.1 % nasal spray    celecoxib (CELEBREX) 200 MG capsule TAKE 1 CAPSULE DAILY   desloratadine (CLARINEX) 5 MG tablet Take 1 tablet (5 mg total) by mouth daily.   fluticasone (FLONASE) 50 MCG/ACT nasal spray    levothyroxine (SYNTHROID) 50 MCG tablet Take 1 tablet (50 mcg total) by mouth daily before breakfast.   losartan (COZAAR) 25 MG tablet Take 1 tablet (25 mg total) by mouth daily.   metFORMIN (GLUCOPHAGE) 500 MG tablet Take 1 tablet (500 mg total) by mouth 2 (two) times daily with a meal.   Multiple Vitamin (MULTIVITAMIN ADULT PO) Take 1 capsule by mouth daily.   omeprazole (PRILOSEC) 40 MG capsule Take 1 capsule  (40 mg total) by mouth daily.   budesonide-formoterol (SYMBICORT) 80-4.5 MCG/ACT inhaler Inhale 2 puffs into the lungs every 4 (four) hours as needed.   No facility-administered encounter medications on file as of 05/11/2022.    Allergies (verified) Lisinopril and Codeine   History: Past Medical History:  Diagnosis Date   Allergy    Diabetes ([place]    Essential hypertension    GERD (gastroesophageal reflux disease)    [place]heumatoid [place]    Thyroid disease    Past Surgical History:  Procedure Laterality Date   ABDOMINAL HYSTERECTOMY  1991   CHOLECYSTECTOMY, LAPAROSCOPIC  1980   Family History  Problem Relation Age of Onset   Cancer Mother    Thyroid disease Father    Diabetes Father    Mental illness Sister    Breast cancer Neg Hx    Social History   Socioeconomic History   Marital status: Widowed    Spouse name: Not on file   Number of children: 1   Years of education: 14   Highest education level: Associate degree: occupational, tHotel manager or vocational program  Occupational History   Not on file  Tobacco Use   Smoking status: Never    Passive exposure: Never   Smokeless tobacco: Never  Vaping Use   Vaping Use: Never used  Substance and Sexual Activity   Alcohol use: Not Currently   Drug use: Never   Sexual activity: Not Currently  Other Topics Concern  Not on file  Social History Narrative   Pt lives alone    Social Determinants of Health   Financial Resource Strain: Low Risk  (05/11/2022)   Overall Financial Resource Strain (CARDIA)    Difficulty of Paying Living Expenses: Not hard at all  Food Insecurity: No Food Insecurity (05/11/2022)   Hunger Vital Sign    Worried About Running Out of Food in the Last Year: Never true    Ran Out of Food in the Last Year: Never true  Transportation Needs: No Transportation Needs (05/11/2022)   PRAPARE - Hydrologist (Medical): No    Lack of Transportation (Non-Medical): No   Physical Activity: Inactive (05/11/2022)   Exercise Vital Sign    Days of Exercise per Week: 0 days    Minutes of Exercise per Session: 0 min  Stress: No Stress Concern Present (05/11/2022)   [place]    Feeling of Stress : Not at all  Social Connections: Moderately Isolated (05/11/2022)   Social Connection and Isolation Panel [NHANES]    Frequency of Communication with Friends and Family: More than three times a week    Frequency of Social Gatherings with Friends and Family: Twice a week    Attends Religious Services: More than 4 times per year    Active Member of [employer] or Organizations: No    Attends Archivist Meetings: Never    Marital Status: Widowed    Tobacco Counseling Counseling given: Not Answered   Clinical Intake:  Pre-visit preparation completed: Yes  Pain : No/denies pain     Nutritional Risks: None Diabetes: Yes CBG done?: No Did pt. bring in CBG monitor from home?: No  How often do you need to have someone help you when you read instructions, pamphlets, or other written materials from your doctor or pharmacy?: 1 - Never  Diabetic?yes Nutrition Risk Assessment:  Has the patient had any N/V/D within the last 2 months?  No  Does the patient have any non-healing wounds?  No  Has the patient had any unintentional weight loss or weight gain?  No   Diabetes:  Is the patient diabetic?  Yes  If diabetic, was a CBG obtained today?  No  Did the patient bring in their glucometer from home?  No  How often do you monitor your CBG's? occasionally.   Financial Strains and Diabetes Management:  Are you having any financial strains with the device, your supplies or your medication? No .  Does the patient want to be seen by Chronic Care Management for management of their diabetes?  No  Would the patient like to be referred to a Nutritionist or for Diabetic Management?  No   Diabetic  Exams:  Diabetic Eye Exam: Completed 04/27/21. Overdue for diabetic eye exam. Pt has been advised about the importance in completing this exam.  Diabetic Foot Exam: Completed 06/09/21. Pt has been advised about the importance in completing this exam.   Interpreter Needed?: No  Information entered by :: Kirke Shaggy, LPN   Activities of Daily Living    05/11/2022    1:06 PM 06/09/2021   10:14 AM  In your present state of health, do you have any difficulty performing the following activities:  Hearing? 0 0  Vision? 0 0  Difficulty concentrating or making decisions? 0 0  Walking or climbing stairs? 0 1  Dressing or bathing? 0 0  Doing errands, shopping? 0 0  Preparing Food and eating ? N   Using the Toilet? N   In the past six months, have you accidently leaked urine? N   Do you have problems with loss of bowel control? N   Managing your Medications? N   Managing your Finances? N   Housekeeping or managing your Housekeeping? N     Patient Care Team: Glean Hess, MD as PCP - General (Internal Medicine) [facility] (Ophthalmology)  Indicate any recent Medical Services you may have received from other than [facility] providers in the past year (date may be approximate).     Assessment:   This is a routine wellness examination for Jermiah.  Hearing/Vision screen Hearing Screening - Comments:: No aids  Vision Screening - Comments:: Wears glasses- Dr.Woodard  Dietary issues and exercise activities discussed: Current Exercise Habits: The patient does not participate in regular exercise at present   Goals Addressed             This Visit's Progress    DIET - EAT MORE FRUITS AND VEGETABLES         Depression Screen    05/11/2022    1:04 PM 02/25/2022    9:10 AM 10/25/2021   11:03 AM 06/09/2021   10:14 AM 05/05/2021    2:56 PM 02/08/2021    8:00 AM 09/30/2020    2:03 PM  PHQ 2/9 Scores  PHQ - 2 Score 0 2 2 0 0 0 0  PHQ- 9 Score 0 '6 9 2  2 4    '$ Fall Risk     05/11/2022    1:06 PM 02/25/2022    9:10 AM 10/25/2021   11:04 AM 06/09/2021   10:14 AM 05/05/2021    2:59 PM  [place] in the past year? 0 0 0 0 1  Number falls in past yr: 0 0 0 0 0  Injury with Fall? 0 0 0 0 0  Risk for fall due to : No Fall Risks No Fall Risks No Fall Risks No Fall Risks No Fall Risks  Follow up Falls prevention discussed;Falls evaluation completed Falls evaluation completed Falls evaluation completed Falls evaluation completed Falls prevention discussed    FALL RISK PREVENTION PERTAINING TO THE HOME:  Any stairs in or around the home? Yes  If so, are there any without handrails? No  Home free of loose throw rugs in walkways, pet beds, electrical cords, etc? Yes  Adequate lighting in your home to reduce risk of falls? Yes   ASSISTIVE DEVICES UTILIZED TO PREVENT FALLS:  Life alert? No  Use of a cane, walker or w/c? No  Grab bars in the bathroom? No  Shower chair or bench in shower? No  Elevated toilet seat or a handicapped toilet? No     Cognitive Function:        05/11/2022    1:11 PM  6CIT Screen  What Year? 0 points  What month? 0 points  What time? 0 points  Count back from 20 0 points  Months in reverse 0 points  Repeat phrase 0 points  Total Score 0 points    Immunizations Immunization History  Administered Date(s) Administered   Tdap 04/05/2022    TDAP status: Up to date  Flu Vaccine status: Declined, Education has been provided regarding the importance of this vaccine but patient still declined. Advised may receive this vaccine at local pharmacy or Health Dept. Aware to provide a copy of the vaccination record if obtained from  local pharmacy or Health Dept. Verbalized acceptance and understanding.  Pneumococcal vaccine status: Declined,  Education has been provided regarding the importance of this vaccine but patient still declined. Advised may receive this vaccine at local pharmacy or Health Dept. Aware to provide a copy of the  vaccination record if obtained from local pharmacy or Health Dept. Verbalized acceptance and understanding.   Covid-19 vaccine status: Declined, Education has been provided regarding the importance of this vaccine but patient still declined. Advised may receive this vaccine at local pharmacy or Health Dept.or vaccine clinic. Aware to provide a copy of the vaccination record if obtained from local pharmacy or Health Dept. Verbalized acceptance and understanding.  Qualifies for Shingles Vaccine? Yes   Zostavax completed No   Shingrix Completed?: No.    Education has been provided regarding the importance of this vaccine. Patient has been advised to call insurance company to determine out of pocket expense if they have not yet received this vaccine. Advised may also receive vaccine at local pharmacy or Health Dept. Verbalized acceptance and understanding.  Screening Tests Health Maintenance  Topic Date Due   COVID-19 Vaccine (1) Never done   OPHTHALMOLOGY EXAM  04/27/2022   Zoster Vaccines- Shingrix (1 of 2) 05/27/2022 (Originally 11/02/1998)   INFLUENZA VACCINE  06/12/2022 (Originally 10/12/2021)   Pneumonia Vaccine 72+ Years old (1 of 1 - PCV) 02/26/2023 (Originally 11/01/2013)   Diabetic kidney evaluation - Urine ACR  06/10/2022   FOOT EXAM  06/10/2022   HEMOGLOBIN A1C  08/27/2022   COLONOSCOPY (Pts 45-49yr Insurance coverage will need to be confirmed)  08/27/2022   Diabetic kidney evaluation - eGFR measurement  10/27/2022   MAMMOGRAM  04/12/2023   Medicare Annual Wellness (AWV)  05/12/2023   DTaP/Tdap/Td (2 - Td or Tdap) 04/05/2032   DEXA SCAN  Completed   Hepatitis C Screening  Completed   HPV VACCINES  Aged Out    Health Maintenance  Health Maintenance Due  Topic Date Due   COVID-19 Vaccine (1) Never done   OPHTHALMOLOGY EXAM  04/27/2022    Colorectal cancer screening: Type of screening: Colonoscopy. Completed 08/26/12. Repeat every 10 years  Mammogram status: Completed 04/11/22.  Repeat every year  Bone Density status: Completed 04/10/20. Results reflect: Bone density results: NORMAL. Repeat every 5 years.  Lung Cancer Screening: (Low Dose CT Chest recommended if Age 74-80years, 30 pack-year currently smoking OR have quit w/in 15years.) does not qualify.     Additional Screening:  Hepatitis C Screening: does qualify; Completed 02/08/21  Vision Screening: Recommended annual ophthalmology exams for early detection of glaucoma and other disorders of the eye. Is the patient up to date with their annual eye exam?  Yes  Who is the provider or what is the name of the office in which the patient attends annual eye exams? Dr.Woodard If pt is not established with a provider, would they like to be referred to a provider to establish care? No .   Dental Screening: Recommended annual dental exams for proper oral hygiene  Community Resource Referral / Chronic Care Management: CRR required this visit?  No   CCM required this visit?  No      Plan:     I have personally reviewed and noted the following in the patient's chart:   Medical and social history Use of alcohol, tobacco or illicit drugs  Current medications and supplements including opioid prescriptions. Patient is not currently taking opioid prescriptions. Functional ability and status Nutritional status Physical activity Advanced  directives List of other physicians Hospitalizations, surgeries, and ER visits in previous 12 months Vitals Screenings to include cognitive, depression, and falls Referrals and appointments  In addition, I have reviewed and discussed with patient certain preventive protocols, quality metrics, and best practice recommendations. A written personalized care plan for preventive services as well as general preventive health recommendations were provided to patient.     Dionisio David, LPN   [id number]   Nurse Notes: none

## 2022-05-11 NOTE — Patient Instructions (Signed)
Alexandria Franklin , Thank you for taking time to come for your Medicare Wellness Visit. I appreciate your ongoing commitment to your health goals. Please review the following plan we discussed and let me know if I can assist you in the future.   These are the goals we discussed:  Goals      DIET - EAT MORE FRUITS AND VEGETABLES        This is a list of the screening recommended for you and due dates:  Health Maintenance  Topic Date Due   COVID-19 Vaccine (1) Never done   Eye exam for diabetics  04/27/2022   Zoster (Shingles) Vaccine (1 of 2) 05/27/2022*   Flu Shot  06/12/2022*   Pneumonia Vaccine (1 of 1 - PCV) 02/26/2023*   Yearly kidney health urinalysis for diabetes  06/10/2022   Complete foot exam   06/10/2022   Hemoglobin A1C  08/27/2022   Colon Cancer Screening  08/27/2022   Yearly kidney function blood test for diabetes  10/27/2022   Mammogram  04/12/2023   Medicare Annual Wellness Visit  05/12/2023   DTaP/Tdap/Td vaccine (2 - Td or Tdap) 04/05/2032   DEXA scan (bone density measurement)  Completed   Hepatitis C Screening: USPSTF Recommendation to screen - Ages 74-74 yo.  Completed   HPV Vaccine  Aged Out  *Topic was postponed. The date shown is not the original due date.    Advanced directives: no  Conditions/risks identified: none  Next appointment: Follow up in one year for your annual wellness visit 05/17/23 @ 10:45 am by phone   Preventive Care 74 Years and Older, Female Preventive care refers to lifestyle choices and visits with your health care provider that can promote health and wellness. What does preventive care include? A yearly physical exam. This is also called an annual well check. Dental exams once or twice a year. Routine eye exams. Ask your health care provider how often you should have your eyes checked. Personal lifestyle choices, including: Daily care of your teeth and gums. Regular physical activity. Eating a healthy diet. Avoiding tobacco and drug  use. Limiting alcohol use. Practicing safe sex. Taking low-dose aspirin every day. Taking vitamin and mineral supplements as recommended by your health care provider. What happens during an annual well check? The services and screenings done by your health care provider during your annual well check will depend on your age, overall health, lifestyle risk factors, and family history of disease. Counseling  Your health care provider may ask you questions about your: Alcohol use. Tobacco use. Drug use. Emotional well-being. Home and relationship well-being. Sexual activity. Eating habits. History of falls. Memory and ability to understand (cognition). Work and work Statistician. Reproductive health. Screening  You may have the following tests or measurements: Height, weight, and BMI. Blood pressure. Lipid and cholesterol levels. These may be checked every 5 years, or more frequently if you are over 45 years old. Skin check. Lung cancer screening. You may have this screening every year starting at age 58 if you have a 30-pack-year history of smoking and currently smoke or have quit within the past 15 years. Fecal occult blood test (FOBT) of the stool. You may have this test every year starting at age 69. Flexible sigmoidoscopy or colonoscopy. You may have a sigmoidoscopy every 5 years or a colonoscopy every 10 years starting at age 68. Hepatitis C blood test. Hepatitis B blood test. Sexually transmitted disease (STD) testing. Diabetes screening. This is done by checking your blood  sugar (glucose) after you have not eaten for a while (fasting). You may have this done every 1-3 years. Bone density scan. This is done to screen for osteoporosis. You may have this done starting at age 53. Mammogram. This may be done every 1-2 years. Talk to your health care provider about how often you should have regular mammograms. Talk with your health care provider about your test results, treatment  options, and if necessary, the need for more tests. Vaccines  Your health care provider may recommend certain vaccines, such as: Influenza vaccine. This is recommended every year. Tetanus, diphtheria, and acellular pertussis (Tdap, Td) vaccine. You may need a Td booster every 10 years. Zoster vaccine. You may need this after age 68. Pneumococcal 13-valent conjugate (PCV13) vaccine. One dose is recommended after age 49. Pneumococcal polysaccharide (PPSV23) vaccine. One dose is recommended after age 9. Talk to your health care provider about which screenings and vaccines you need and how often you need them. This information is not intended to replace advice given to you by your health care provider. Make sure you discuss any questions you have with your health care provider. Document Released: 03/27/2015 Document Revised: 11/18/2015 Document Reviewed: 12/30/2014 Elsevier Interactive Patient Education  2017 [place] Prevention in the Home Falls can cause injuries. They can happen to people of all ages. There are many things you can do to make your home safe and to help prevent falls. What can I do on the outside of my home? Regularly fix the edges of walkways and driveways and fix any cracks. Remove anything that might make you trip as you walk through a door, such as a raised step or threshold. Trim any bushes or trees on the path to your home. Use bright outdoor lighting. Clear any walking paths of anything that might make someone trip, such as rocks or tools. Regularly check to see if handrails are loose or broken. Make sure that both sides of any steps have handrails. Any raised decks and porches should have guardrails on the edges. Have any leaves, snow, or ice cleared regularly. Use sand or salt on walking paths during winter. Clean up any spills in your garage right away. This includes oil or grease spills. What can I do in the bathroom? Use night lights. Install grab  bars by the toilet and in the tub and shower. Do not use towel bars as grab bars. Use non-skid mats or decals in the tub or shower. If you need to sit down in the shower, use a plastic, non-slip stool. Keep the floor dry. Clean up any water that spills on the floor as soon as it happens. Remove soap buildup in the tub or shower regularly. Attach bath mats securely with double-sided non-slip rug tape. Do not have throw rugs and other things on the floor that can make you trip. What can I do in the bedroom? Use night lights. Make sure that you have a light by your bed that is easy to reach. Do not use any sheets or blankets that are too big for your bed. They should not hang down onto the floor. Have a firm chair that has side arms. You can use this for support while you get dressed. Do not have throw rugs and other things on the floor that can make you trip. What can I do in the kitchen? Clean up any spills right away. Avoid walking on wet floors. Keep items that you use a lot in easy-to-reach  places. If you need to reach something above you, use a strong step stool that has a grab bar. Keep electrical cords out of the way. Do not use floor polish or wax that makes floors slippery. If you must use wax, use non-skid floor wax. Do not have throw rugs and other things on the floor that can make you trip. What can I do with my stairs? Do not leave any items on the stairs. Make sure that there are handrails on both sides of the stairs and use them. Fix handrails that are broken or loose. Make sure that handrails are as long as the stairways. Check any carpeting to make sure that it is firmly attached to the stairs. Fix any carpet that is loose or worn. Avoid having throw rugs at the top or bottom of the stairs. If you do have throw rugs, attach them to the floor with carpet tape. Make sure that you have a light switch at the top of the stairs and the bottom of the stairs. If you do not have them,  ask someone to add them for you. What else can I do to help prevent falls? Wear shoes that: Do not have high heels. Have rubber bottoms. Are comfortable and fit you well. Are closed at the toe. Do not wear sandals. If you use a stepladder: Make sure that it is fully opened. Do not climb a closed stepladder. Make sure that both sides of the stepladder are locked into place. Ask someone to hold it for you, if possible. Clearly mark and make sure that you can see: Any grab bars or handrails. First and last steps. Where the edge of each step is. Use tools that help you move around (mobility aids) if they are needed. These include: Canes. Walkers. Scooters. Crutches. Turn on the lights when you go into a dark area. Replace any light bulbs as soon as they burn out. Set up your furniture so you have a clear path. Avoid moving your furniture around. If any of your floors are uneven, fix them. If there are any pets around you, be aware of where they are. Review your medicines with your doctor. Some medicines can make you feel dizzy. This can increase your chance of falling. Ask your doctor what other things that you can do to help prevent falls. This information is not intended to replace advice given to you by your health care provider. Make sure you discuss any questions you have with your health care provider. Document Released: 12/25/2008 Document Revised: 08/06/2015 Document Reviewed: 04/04/2014 Elsevier Interactive Patient Education  2017 [employer].

## 2022-05-26 ENCOUNTER — Other Ambulatory Visit: Payer: Self-pay | Admitting: Internal Medicine

## 2022-05-26 DIAGNOSIS — E039 Hypothyroidism, unspecified: Secondary | ICD-10-CM

## 2022-05-26 MED ORDER — LEVOTHYROXINE SODIUM 50 MCG PO TABS: 50.0000 ug | ORAL_TABLET | Freq: Every day | ORAL | 0 refills | Status: DC

## 2022-05-26 NOTE — Telephone Encounter (Signed)
Requested Prescriptions  Pending Prescriptions Disp Refills   levothyroxine (SYNTHROID) 50 MCG tablet 90 tablet 0    Sig: Take by mouth daily before breakfast.     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[facility])   [place] at [facility], Jesse Sans, MD   11 months ago Annual physical exam   [place] at [facility], Jesse Sans, MD   1 year ago Type II diabetes mellitus with complication [facility])   [place] [facility] at [facility], Jesse Sans, MD   1 year ago Gastroesophageal reflux disease, unspecified whether esophagitis present   [place] at [facility], Jesse Sans, MD       Future Appointments             In 1 month Army Melia, Jesse Sans, MD [place] at [facility], [facility]   In 1 month Armando Reichert, MD [facility] at [facility]

## 2022-05-26 NOTE — Telephone Encounter (Signed)
Medication Refill - Medication: levothyroxine (SYNTHROID) 50 MCG tablet   Has the patient contacted their pharmacy? yes (Agent: If no, request that the patient contact the pharmacy for the refill. If patient does not wish to contact the pharmacy document the reason why and proceed with request.) (Agent: If yes, when and what did the pharmacy advise?)contact pcp  Preferred Pharmacy (with phone number or street name):  [place], [place] Phone: [phone]  Fax: [phone]     Has the patient been seen for an appointment in the last year OR does the patient have an upcoming appointment? yes  Agent: Please be advised that RX refills may take up to 3 business days. We ask that you follow-up with your pharmacy.

## 2022-06-08 ENCOUNTER — Other Ambulatory Visit: Payer: Self-pay | Admitting: Internal Medicine

## 2022-06-08 DIAGNOSIS — M170 Bilateral primary osteoarthritis of knee: Secondary | ICD-10-CM

## 2022-06-08 DIAGNOSIS — E790 Hyperuricemia without signs of inflammatory arthritis and tophaceous disease: Secondary | ICD-10-CM

## 2022-06-08 NOTE — Telephone Encounter (Signed)
Requested Prescriptions  Pending Prescriptions Disp Refills   allopurinol (ZYLOPRIM) 100 MG tablet [Pharmacy Med Name: ALLOPURINOL TABS 100MG ] 90 tablet 0    Sig: TAKE 1 TABLET DAILY     Endocrinology:  Gout Agents - allopurinol Failed - 06/08/2022  3:18 AM      Failed - Uric Acid in normal range and within 360 days    Uric Acid  Date Value Ref Range Status  06/09/2021 5.0 3.1 - 7.9 mg/dL Final    Comment:               Therapeutic target for gout patients: <6.0         Failed - Cr in normal range and within 360 days    Creatinine, Ser  Date Value Ref Range Status  10/26/2021 1.05 (H) 0.57 - 1.00 mg/dL Final         Passed - Valid encounter within last 12 months    Recent Outpatient Visits           3 months ago Type II diabetes mellitus with complication [facility])   [place] [facility] at [facility], Jesse Sans, MD   7 months ago Diabetes mellitus with microalbuminuria [facility])   [place] at [facility], Jesse Sans, MD   12 months ago Annual physical exam   [facility] at [facility], Jesse Sans, MD   1 year ago Type II diabetes mellitus with complication [facility])   [place] [facility] at [facility], Jesse Sans, MD   1 year ago Gastroesophageal reflux disease, unspecified whether esophagitis present   [place] at [facility], Jesse Sans, MD       Future Appointments             In 2 weeks Army Melia Jesse Sans, MD [place] at [facility], [facility]   In 3 weeks Armando Reichert, MD [facility] at [place] within normal limits and completed in the last 12 months    WBC  Date Value Ref Range Status  03/30/2022 11.2 (H) 3.4 - 10.8 x10E3/uL Final   RBC  Date Value Ref Range Status  03/30/2022 4.45 3.77  - 5.28 x10E6/uL Final  07/22/2019 4.67 3.87 - 5.11 Final   Hemoglobin  Date Value Ref Range Status  03/30/2022 13.2 11.1 - 15.9 g/dL Final   Hematocrit  Date Value Ref Range Status  03/30/2022 40.7 34.0 - 46.6 % Final   MCHC  Date Value Ref Range Status  03/30/2022 32.4 31.5 - 35.7 g/dL Final   [facility]  Date Value Ref Range Status  03/30/2022 29.7 26.6 - 33.0 pg Final   MCV  Date Value Ref Range Status  03/30/2022 92 79 - 97 fL Final   No results found for: "PLTCOUNTKUC", "LABPLAT", "POCPLA" RDW  Date Value Ref Range Status  03/30/2022 14.0 11.7 - 15.4 % Final          celecoxib (CELEBREX) 200 MG capsule [Pharmacy Med Name: CELECOXIB CAPS 200MG ] 90 capsule 0    Sig: TAKE 1 CAPSULE DAILY     Analgesics:  COX2 Inhibitors Failed - 06/08/2022  3:18 AM      Failed - Manual Review: Labs are only required if the patient has  taken medication for more than 8 weeks.      Failed - Cr in normal range and within 360 days    Creatinine, Ser  Date Value Ref Range Status  10/26/2021 1.05 (H) 0.57 - 1.00 mg/dL Final         Failed - AST in normal range and within 360 days    AST  Date Value Ref Range Status  06/09/2021 15 0 - 40 IU/L Final         Failed - ALT in normal range and within 360 days    ALT  Date Value Ref Range Status  06/09/2021 11 0 - 32 IU/L Final         Passed - HGB in normal range and within 360 days    Hemoglobin  Date Value Ref Range Status  03/30/2022 13.2 11.1 - 15.9 g/dL Final         Passed - HCT in normal range and within 360 days    Hematocrit  Date Value Ref Range Status  03/30/2022 40.7 34.0 - 46.6 % Final         Passed - eGFR is 30 or above and within 360 days    GFR calc Af Amer  Date Value Ref Range Status  01/23/2020 62  Final   GFR calc non Af Amer  Date Value Ref Range Status  01/23/2020 53  Final   eGFR  Date Value Ref Range Status  10/26/2021 56 (L) >59 mL/min/1.73 Final         Passed - Patient is not pregnant       Passed - Valid encounter within last 12 months    Recent Outpatient Visits           3 months ago Type II diabetes mellitus with complication ([facility])   [place] [facility] at [facility], Jesse Sans, MD   7 months ago Diabetes mellitus with microalbuminuria [facility])   [place] at [facility], Jesse Sans, MD   12 months ago Annual physical exam   [facility] at [facility], Jesse Sans, MD   1 year ago Type II diabetes mellitus with complication [facility])   [place] [facility] at [facility], Jesse Sans, MD   1 year ago Gastroesophageal reflux disease, unspecified whether esophagitis present   [place] at [facility], Jesse Sans, MD       Future Appointments             In 2 weeks Army Melia, Jesse Sans, MD [place] at [facility], [facility]   In 3 weeks Armando Reichert, MD [facility] at [facility]

## 2022-06-21 ENCOUNTER — Ambulatory Visit: Payer: Medicare PPO

## 2022-06-23 ENCOUNTER — Ambulatory Visit: Payer: Medicare PPO | Attending: Student in an Organized Health Care Education/Training Program

## 2022-06-23 DIAGNOSIS — R053 Chronic cough: Secondary | ICD-10-CM | POA: Diagnosis not present

## 2022-06-23 DIAGNOSIS — R0602 Shortness of breath: Secondary | ICD-10-CM | POA: Insufficient documentation

## 2022-06-23 LAB — PULMONARY FUNCTION TEST ARMC ONLY
DL/VA % pred: 105 %
DL/VA: 4.4 ml/min/mmHg/L
DLCO unc % pred: 81 %
DLCO unc: 14.69 ml/min/mmHg
FEF 25-75 Post: 1.04 L/sec
FEF 25-75 Pre: 1.32 L/sec
FEF2575-%Change-Post: -21 %
FEF2575-%Pred-Post: 63 %
FEF2575-%Pred-Pre: 80 %
FEV1-%Change-Post: -16 %
FEV1-%Pred-Post: 56 %
FEV1-%Pred-Pre: 68 %
FEV1-Post: 1.12 L
FEV1-Pre: 1.35 L
FEV1FVC-%Change-Post: -21 %
FEV1FVC-%Pred-Pre: 97 %
FEV6-%Change-Post: 10 %
FEV6-%Pred-Post: 76 %
FEV6-%Pred-Pre: 68 %
FEV6-Post: 1.92 L
FEV6-Pre: 1.73 L
FEV6FVC-%Pred-Post: 105 %
FEV6FVC-%Pred-Pre: 105 %
FVC-%Change-Post: 6 %
FVC-%Pred-Post: 73 %
FVC-%Pred-Pre: 69 %
FVC-Post: 1.95 L
FVC-Pre: 1.83 L
Post FEV1/FVC ratio: 58 %
Post FEV6/FVC ratio: 100 %
Pre FEV1/FVC ratio: 74 %
Pre FEV6/FVC Ratio: 100 %
RV % pred: 114 %
RV: 2.47 L
TLC % pred: 91 %
TLC: 4.33 L

## 2022-06-23 MED ORDER — ALBUTEROL SULFATE (2.5 MG/3ML) 0.083% IN NEBU
2.5000 mg | INHALATION_SOLUTION | Freq: Once | RESPIRATORY_TRACT | Status: AC
  Administered 2022-06-23: 2.5 mg via RESPIRATORY_TRACT

## 2022-06-27 ENCOUNTER — Ambulatory Visit (INDEPENDENT_AMBULATORY_CARE_PROVIDER_SITE_OTHER): Payer: Medicare PPO | Admitting: Internal Medicine

## 2022-06-27 ENCOUNTER — Encounter: Payer: Self-pay | Admitting: Internal Medicine

## 2022-06-27 VITALS — BP 128/74 | HR 68 | Resp 99 | Ht 62.0 in | Wt 210.0 lb

## 2022-06-27 DIAGNOSIS — E785 Hyperlipidemia, unspecified: Secondary | ICD-10-CM | POA: Diagnosis not present

## 2022-06-27 DIAGNOSIS — Z1211 Encounter for screening for malignant neoplasm of colon: Secondary | ICD-10-CM

## 2022-06-27 DIAGNOSIS — Z0000 Encounter for general adult medical examination without abnormal findings: Secondary | ICD-10-CM

## 2022-06-27 DIAGNOSIS — E039 Hypothyroidism, unspecified: Secondary | ICD-10-CM

## 2022-06-27 DIAGNOSIS — E1169 Type 2 diabetes mellitus with other specified complication: Secondary | ICD-10-CM | POA: Diagnosis not present

## 2022-06-27 DIAGNOSIS — N1831 Chronic kidney disease, stage 3a: Secondary | ICD-10-CM | POA: Diagnosis not present

## 2022-06-27 DIAGNOSIS — E790 Hyperuricemia without signs of inflammatory arthritis and tophaceous disease: Secondary | ICD-10-CM | POA: Diagnosis not present

## 2022-06-27 DIAGNOSIS — I10 Essential (primary) hypertension: Secondary | ICD-10-CM | POA: Diagnosis not present

## 2022-06-27 DIAGNOSIS — E118 Type 2 diabetes mellitus with unspecified complications: Secondary | ICD-10-CM | POA: Diagnosis not present

## 2022-06-27 NOTE — Assessment & Plan Note (Signed)
will consider reducing dose of Allopurinal when UA returns

## 2022-06-27 NOTE — Assessment & Plan Note (Addendum)
Clinically stable without s/s of hypoglycemia. Tolerating metformin well without side effects. She has noticed some mild burning sensation in her feet at night. Lab Results  Component Value Date   HGBA1C 6.0 (A) 02/25/2022  Eye exam, U/Cr and foot exam due

## 2022-06-27 NOTE — Progress Notes (Signed)
Date:  06/27/2022   Name:  Alexandria Franklin   DOB:  1951/01/06   MRN:  [id number]   Chief Complaint: Annual Exam Vicky Kalea Perine is a 72 y.o. female who presents today for her Complete Annual Exam. She feels fairly well. She reports being unable to exercise due to her breathing problems.. She reports she is sleeping fairly well. Breast complaints none.  Mammogram: 03/2022 DEXA: 03/2020 Colonoscopy: 08/2012 repeat 10 yrs  Health Maintenance Due  Topic Date Due   Diabetic kidney evaluation - Urine ACR  06/10/2022    Immunization History  Administered Date(s) Administered   Tdap 04/05/2022    Hypertension This is a chronic problem. The problem is controlled. Associated symptoms include shortness of breath. Pertinent negatives include no chest pain, headaches or palpitations. Past treatments include angiotensin blockers. The current treatment provides significant improvement. Identifiable causes of hypertension include a thyroid problem.  Diabetes She presents for her follow-up diabetic visit. She has type 2 diabetes mellitus. Her disease course has been stable. Pertinent negatives for hypoglycemia include no dizziness, headaches, nervousness/anxiousness or tremors. Pertinent negatives for diabetes include no chest pain, no fatigue, no polydipsia and no polyuria. Current diabetic treatment includes oral agent (monotherapy) (metformin). An ACE inhibitor/angiotensin II receptor blocker is being taken. Eye exam is not current.  Hyperlipidemia This is a chronic problem. The problem is uncontrolled. Recent lipid tests were reviewed and are high. Associated symptoms include myalgias (burning sensation in feet at night) and shortness of breath. Pertinent negatives include no chest pain. Current antihyperlipidemic treatment includes statins. The current treatment provides significant improvement of lipids.  Thyroid Problem Presents for follow-up visit. Patient reports no anxiety, constipation,  diarrhea, fatigue, palpitations or tremors. The symptoms have been stable. Her past medical history is significant for hyperlipidemia.    Lab Results  Component Value Date   NA 139 10/26/2021   K 4.8 10/26/2021   CO2 23 10/26/2021   GLUCOSE 111 (H) 10/26/2021   BUN 14 10/26/2021   CREATININE 1.05 (H) 10/26/2021   CALCIUM 9.5 10/26/2021   EGFR 56 (L) 10/26/2021   GFRNONAA 53 01/23/2020   Lab Results  Component Value Date   CHOL 161 02/08/2021   HDL 43 02/08/2021   LDLCALC 92 02/08/2021   TRIG 148 02/08/2021   CHOLHDL 3.7 02/08/2021   Lab Results  Component Value Date   TSH 2.790 06/09/2021   Lab Results  Component Value Date   HGBA1C 6.0 (A) 02/25/2022   Lab Results  Component Value Date   WBC 11.2 (H) 03/30/2022   HGB 13.2 03/30/2022   HCT 40.7 03/30/2022   MCV 92 03/30/2022   PLT 424 03/30/2022   Lab Results  Component Value Date   ALT 11 06/09/2021   AST 15 06/09/2021   ALKPHOS 87 06/09/2021   BILITOT 0.6 06/09/2021   No results found for: "25OHVITD2", "25OHVITD3", "VD25OH"   Review of Systems  Constitutional:  Negative for chills, fatigue and fever.  HENT:  Negative for congestion, hearing loss, tinnitus, trouble swallowing and voice change.   Eyes:  Negative for visual disturbance.  Respiratory:  Positive for cough and shortness of breath. Negative for chest tightness and wheezing.   Cardiovascular:  Negative for chest pain, palpitations and leg swelling.  Gastrointestinal:  Negative for abdominal pain, constipation, diarrhea and vomiting.  Endocrine: Negative for polydipsia and polyuria.  Genitourinary:  Negative for dysuria, frequency, genital sores, vaginal bleeding and vaginal discharge.  Musculoskeletal:  Positive for myalgias (burning  sensation in feet at night). Negative for arthralgias, gait problem and joint swelling.  Skin:  Negative for color change and rash.  Neurological:  Negative for dizziness, tremors, light-headedness and headaches.   Hematological:  Negative for adenopathy. Does not bruise/bleed easily.  Psychiatric/Behavioral:  Negative for dysphoric mood and sleep disturbance. The patient is not nervous/anxious.     Patient Active Problem List   Diagnosis Date Noted   Stage 3a chronic kidney disease 10/25/2021   BMI 38.0-38.9,adult 06/09/2021   Chronic cough 06/09/2021   Hyperlipidemia associated with type 2 diabetes mellitus 10/07/2020   Acquired hypothyroidism 09/30/2020   Asymptomatic hyperuricemia 09/30/2020   Diabetes mellitus with microalbuminuria 09/30/2020   Type II diabetes mellitus with complication 09/23/2020   Essential hypertension 09/23/2020   GERD (gastroesophageal reflux disease) 09/23/2020   Osteoarthritis of both knees 09/23/2020    Allergies  Allergen Reactions   Lisinopril Cough   Codeine Nausea Only    Past Surgical History:  Procedure Laterality Date   ABDOMINAL HYSTERECTOMY  1991   CHOLECYSTECTOMY, LAPAROSCOPIC  1980    Social History   Tobacco Use   Smoking status: Never    Passive exposure: Never   Smokeless tobacco: Never  Vaping Use   Vaping Use: Never used  Substance Use Topics   Alcohol use: Not Currently   Drug use: Never     Medication list has been reviewed and updated.  Current Meds  Medication Sig   allopurinol (ZYLOPRIM) 100 MG tablet TAKE 1 TABLET DAILY   atorvastatin (LIPITOR) 10 MG tablet Take 1 tablet (10 mg total) by mouth daily.   azelastine (ASTELIN) 0.1 % nasal spray    celecoxib (CELEBREX) 200 MG capsule TAKE 1 CAPSULE DAILY   desloratadine (CLARINEX) 5 MG tablet Take 1 tablet (5 mg total) by mouth daily.   fluticasone (FLONASE) 50 MCG/ACT nasal spray    levothyroxine (SYNTHROID) 50 MCG tablet Take 1 tablet (50 mcg total) by mouth daily before breakfast.   losartan (COZAAR) 25 MG tablet Take 1 tablet (25 mg total) by mouth daily.   metFORMIN (GLUCOPHAGE) 500 MG tablet Take 1 tablet (500 mg total) by mouth 2 (two) times daily with a meal.    Multiple Vitamin (MULTIVITAMIN ADULT PO) Take 1 capsule by mouth daily.   omeprazole (PRILOSEC) 40 MG capsule Take 1 capsule (40 mg total) by mouth daily.       06/27/2022    9:45 AM 02/25/2022    9:10 AM 10/25/2021   11:04 AM 06/09/2021   10:14 AM  GAD 7 : Generalized Anxiety Score  Nervous, Anxious, on Edge 0 0 0 0  Control/stop worrying 0 0 0 1  Worry too much - different things 0 Trouble relaxing 0 0 0 0  Restless 0 0 0 1  Easily annoyed or irritable 0 0 0 0  Afraid - awful might happen 0 0 0 0  Total GAD 7 Score 0 Anxiety Difficulty Not difficult at all Not difficult at all Not difficult at all        06/27/2022    9:45 AM 05/11/2022    1:04 PM 02/25/2022    9:10 AM  Depression screen PHQ 2/9  Decreased Interest 0 0 1  Down, Depressed, Hopeless 0 0 1  PHQ - 2 Score 0 0 2  Altered sleeping 1 0 1  Tired, decreased energy 2 0 3  Change in appetite 0 0 0  Feeling bad or failure  about yourself  0 0 0  Trouble concentrating 0 0 0  Moving slowly or fidgety/restless 1 0 0  Suicidal thoughts 0 0 0  PHQ-9 Score 4 0 6  Difficult doing work/chores Not difficult at all Not difficult at all Not difficult at all    BP Readings from Last 3 Encounters:  06/27/22 128/74  04/05/22 (!) 149/87  03/28/22 122/74    Physical Exam Vitals and nursing note reviewed.  Constitutional:      General: She is not in acute distress.    Appearance: She is well-developed.  HENT:     Head: Normocephalic and atraumatic.     Right Ear: Tympanic membrane and ear canal normal.     Left Ear: Tympanic membrane and ear canal normal.     Nose:     Right Sinus: No maxillary sinus tenderness.     Left Sinus: No maxillary sinus tenderness.  Eyes:     General: No scleral icterus.       Right eye: No discharge.        Left eye: No discharge.     Conjunctiva/sclera: Conjunctivae normal.  Neck:     Thyroid: No thyromegaly.     Vascular: No carotid bruit.  Cardiovascular:     Rate and  Rhythm: Normal rate and regular rhythm.     Pulses: Normal pulses.     Heart sounds: Normal heart sounds.  Pulmonary:     Effort: Pulmonary effort is normal. No respiratory distress.     Breath sounds: No wheezing.  Chest:  Breasts:    Right: No mass, nipple discharge, skin change or tenderness.     Left: No mass, nipple discharge, skin change or tenderness.  Abdominal:     General: Bowel sounds are normal.     Palpations: Abdomen is soft.     Tenderness: There is no abdominal tenderness.  Musculoskeletal:     Cervical back: Normal range of motion. No erythema.     Right lower leg: No edema.     Left lower leg: No edema.  Lymphadenopathy:     Cervical: No cervical adenopathy.  Skin:    General: Skin is warm and dry.     Capillary Refill: Capillary refill takes less than 2 seconds.     Findings: No rash.  Neurological:     General: No focal deficit present.     Mental Status: She is alert and oriented to person, place, and time.     Cranial Nerves: No cranial nerve deficit.     Sensory: No sensory deficit.     Deep Tendon Reflexes: Reflexes are normal and symmetric.  Psychiatric:        Attention and Perception: Attention normal.        Mood and Affect: Mood normal.    Diabetic Foot Exam - Simple   Simple Foot Form Diabetic Foot exam was performed with the following findings: Yes 06/27/2022  9:59 AM  Visual Inspection No deformities, no ulcerations, no other skin breakdown bilaterally: Yes Sensation Testing Intact to touch and monofilament testing bilaterally: Yes Pulse Check Posterior Tibialis and Dorsalis pulse intact bilaterally: Yes Comments      Wt Readings from Last 3 Encounters:  06/27/22 210 lb (95.3 kg)  05/11/22 207 lb (93.9 kg)  04/05/22 207 lb (93.9 kg)    BP 128/74   Pulse 68   Resp (!) 99   Ht 5\' 2"  (1.575 m)   Wt 210 lb (95.3 kg)   BMI  38.41 kg/m   Assessment and Plan:  Problem List Items Addressed This Visit       Cardiovascular and  Mediastinum   Essential hypertension (Chronic)    Clinically stable exam with well controlled BP on losartan. Tolerating medications without side effects. Pt to continue current regimen and low sodium diet.       Relevant Orders   CBC with Differential/Platelet   Comprehensive metabolic panel     Endocrine   Acquired hypothyroidism (Chronic)    Supplemented Lab Results  Component Value Date   TSH 2.790 06/09/2021        Relevant Orders   TSH + free T4   Hyperlipidemia associated with type 2 diabetes mellitus (Chronic)    Tolerating statin medications without concerns LDL is  Lab Results  Component Value Date   LDLCALC 92 02/08/2021  On atorvastatin 10 mg with a goal of < 70. Current dose will be adjusted if needed.       Relevant Orders   Lipid panel   Type II diabetes mellitus with complication (Chronic)    Clinically stable without s/s of hypoglycemia. Tolerating metformin well without side effects. She has noticed some mild burning sensation in her feet at night. Lab Results  Component Value Date   HGBA1C 6.0 (A) 02/25/2022  Eye exam, U/Cr and foot exam due      Relevant Orders   Hemoglobin A1c   Microalbumin / creatinine urine ratio     Genitourinary   Stage 3a chronic kidney disease   Relevant Orders   Comprehensive metabolic panel     Other   Asymptomatic hyperuricemia    will consider reducing dose of Allopurinal when UA returns      Other Visit Diagnoses     Annual physical exam    -  Primary   up to date on mammograms Declines all immunizations   Colon cancer screening       seeing GI next month and will discuss then   Elevated blood uric acid level       on allopurinal for gout prevention will consider reducing dose of Allopurinal when UA returns   Relevant Orders   Uric acid       Return in about 4 months (around 10/27/2022) for DM, HTN.   Partially dictated using Dragon software, any errors are not intentional.  Reubin Milan, MD [facility] [place], [place]

## 2022-06-27 NOTE — Assessment & Plan Note (Signed)
Clinically stable exam with well controlled BP on losartan. Tolerating medications without side effects. Pt to continue current regimen and low sodium diet.  

## 2022-06-27 NOTE — Assessment & Plan Note (Signed)
Tolerating statin medications without concerns LDL is  Lab Results  Component Value Date   LDLCALC 92 02/08/2021  On atorvastatin 10 mg with a goal of < 70. Current dose will be adjusted if needed.

## 2022-06-27 NOTE — Assessment & Plan Note (Signed)
Supplemented Lab Results  Component Value Date   TSH 2.790 06/09/2021

## 2022-06-29 ENCOUNTER — Other Ambulatory Visit: Payer: Self-pay | Admitting: Internal Medicine

## 2022-06-29 DIAGNOSIS — E1169 Type 2 diabetes mellitus with other specified complication: Secondary | ICD-10-CM

## 2022-06-29 LAB — LIPID PANEL
Chol/HDL Ratio: 3 ratio (ref 0.0–4.4)
Cholesterol, Total: 142 mg/dL (ref 100–199)
HDL: 48 mg/dL (ref >39)
LDL Chol Calc (NIH): 65 mg/dL (ref 0–99)
Triglycerides: 172 mg/dL — ABNORMAL HIGH (ref 0–149)
VLDL Cholesterol Cal: 29 mg/dL (ref 5–40)

## 2022-06-29 LAB — CBC WITH DIFFERENTIAL/PLATELET
Basophils Absolute: 0.1 10*3/uL (ref 0.0–0.2)
Basos: 1 %
EOS (ABSOLUTE): 0.4 10*3/uL (ref 0.0–0.4)
Eos: 4 %
Hematocrit: 41.1 % (ref 34.0–46.6)
Hemoglobin: 13.6 g/dL (ref 11.1–15.9)
Immature Grans (Abs): 0 10*3/uL (ref 0.0–0.1)
Immature Granulocytes: 0 %
Lymphocytes Absolute: 3.6 10*3/uL — ABNORMAL HIGH (ref 0.7–3.1)
Lymphs: 34 %
MCH: 29.8 pg (ref 26.6–33.0)
MCHC: 33.1 g/dL (ref 31.5–35.7)
MCV: 90 fL (ref 79–97)
Monocytes Absolute: 0.6 10*3/uL (ref 0.1–0.9)
Monocytes: 5 %
Neutrophils Absolute: 6 10*3/uL (ref 1.4–7.0)
Neutrophils: 56 %
Platelets: 426 10*3/uL (ref 150–450)
RBC: 4.56 x10E6/uL (ref 3.77–5.28)
RDW: 13.6 % (ref 11.7–15.4)
WBC: 10.6 10*3/uL (ref 3.4–10.8)

## 2022-06-29 LAB — COMPREHENSIVE METABOLIC PANEL
ALT: 13 IU/L (ref 0–32)
AST: 12 IU/L (ref 0–40)
Albumin/Globulin Ratio: 1.7 (ref 1.2–2.2)
Albumin: 4.3 g/dL (ref 3.8–4.8)
Alkaline Phosphatase: 101 IU/L (ref 44–121)
BUN/Creatinine Ratio: 14 (ref 12–28)
BUN: 14 mg/dL (ref 8–27)
Bilirubin Total: 0.6 mg/dL (ref 0.0–1.2)
CO2: 20 mmol/L (ref 20–29)
Calcium: 9.8 mg/dL (ref 8.7–10.3)
Chloride: 105 mmol/L (ref 96–106)
Creatinine, Ser: 0.97 mg/dL (ref 0.57–1.00)
Globulin, Total: 2.5 g/dL (ref 1.5–4.5)
Glucose: 108 mg/dL — ABNORMAL HIGH (ref 70–99)
Potassium: 4.9 mmol/L (ref 3.5–5.2)
Sodium: 141 mmol/L (ref 134–144)
Total Protein: 6.8 g/dL (ref 6.0–8.5)
eGFR: 62 mL/min/{1.73_m2} (ref >59)

## 2022-06-29 LAB — MICROALBUMIN / CREATININE URINE RATIO
Creatinine, Urine: 49.2 mg/dL
Microalb/Creat Ratio: 15 mg/g creat (ref 0–29)
Microalbumin, Urine: 7.6 ug/mL

## 2022-06-29 LAB — TSH+FREE T4
Free T4: 1.09 ng/dL (ref 0.82–1.77)
TSH: 2.42 u[IU]/mL (ref 0.450–4.500)

## 2022-06-29 LAB — URIC ACID: Uric Acid: 5.3 mg/dL (ref 3.1–7.9)

## 2022-06-29 LAB — HEMOGLOBIN A1C
Est. average glucose Bld gHb Est-mCnc: 134 mg/dL
Hgb A1c MFr Bld: 6.3 % — ABNORMAL HIGH (ref 4.8–5.6)

## 2022-06-29 MED ORDER — ATORVASTATIN CALCIUM 20 MG PO TABS: 20.0000 mg | ORAL_TABLET | Freq: Every day | ORAL | 1 refills | Status: DC

## 2022-07-05 ENCOUNTER — Ambulatory Visit (INDEPENDENT_AMBULATORY_CARE_PROVIDER_SITE_OTHER): Payer: Medicare PPO | Admitting: Student in an Organized Health Care Education/Training Program

## 2022-07-05 ENCOUNTER — Encounter: Payer: Self-pay | Admitting: Student in an Organized Health Care Education/Training Program

## 2022-07-05 VITALS — BP 120/70 | HR 81 | Temp 97.7°F | Ht 62.0 in | Wt 210.0 lb

## 2022-07-05 DIAGNOSIS — R0602 Shortness of breath: Secondary | ICD-10-CM | POA: Diagnosis not present

## 2022-07-05 DIAGNOSIS — R053 Chronic cough: Secondary | ICD-10-CM | POA: Diagnosis not present

## 2022-07-05 LAB — NITRIC OXIDE: Nitric Oxide: 11

## 2022-07-05 MED ORDER — FLUTICASONE-SALMETEROL 45-21 MCG/ACT IN AERO: 2.0000 | INHALATION_SPRAY | Freq: Two times a day (BID) | RESPIRATORY_TRACT | 12 refills | Status: DC

## 2022-07-05 NOTE — Progress Notes (Signed)
Synopsis: Referred in for cough by Reubin Milan, MD  Assessment & Plan:   1. Shortness of breath 2. Chronic cough  The patient is presenting for the evaluation of shortness of breath, chronic cough, and wheezing.    PFT shows signs of minimal obstructive airways with no reversibility, thou she does have a previous eosinophil count of 500 on a prior CBC. ANCA profile was negative, as was her allergen panel. Double contrast esophagogram is notable for severe reflux disease with an associated esophageal stricture.  I suspect most of her cough is driven by the severe reflux disease and today I spent a good amount of time counseling the patient on dietary and lifestyle interventions that she can do to help with this. Specifically we discussed foods that exacerbate reflux (such as coffee, acidic drinks, tomato based foods, spicy foods, deep fried foods), head of bed elevation, and spacing out the time between her last meal and her bed time.  Given lack of improvement with desloratadine, we will discontinue that today. She hasn't tried an inhaler yet and I will switch the Symbicort for Advair HFA and assess for symptomtic improvement on follow up. That said, I suspect most of her symptoms are driven by a combination of reflux disease as well as central obesity with a notable drop in the ERV noted on PFT.  - fluticasone-salmeterol (ADVAIR HFA) 45-21 MCG/ACT inhaler; Inhale 2 puffs into the lungs 2 (two) times daily.  Dispense: 1 each; Refill: 12 - Nitric oxide today at 11 ppb - Follow up with gastroenterology for esophageal stricture and reflux disease  Return in about 6 months (around 01/04/2023).  I spent 30 minutes caring for this patient today, including preparing to see the patient, obtaining a medical history , reviewing a separately obtained history, performing a medically appropriate examination and/or evaluation, counseling and educating the patient/family/caregiver, ordering  medications, tests, or procedures, documenting clinical information in the electronic health record, and independently interpreting results (not separately reported/billed) and communicating results to the patient/family/caregiver  Raechel Chute, MD [facility] 07/05/2022 10:19 AM    End of visit medications:  Meds ordered this encounter  Medications   fluticasone-salmeterol (ADVAIR HFA) 45-21 MCG/ACT inhaler    Sig: Inhale 2 puffs into the lungs 2 (two) times daily.    Dispense:  1 each    Refill:  12     Current Outpatient Medications:    allopurinol (ZYLOPRIM) 100 MG tablet, TAKE 1 TABLET DAILY, Disp: 90 tablet, Rfl: 0   atorvastatin (LIPITOR) 20 MG tablet, Take 1 tablet (20 mg total) by mouth daily., Disp: 90 tablet, Rfl: 1   azelastine (ASTELIN) 0.1 % nasal spray, , Disp: , Rfl:    celecoxib (CELEBREX) 200 MG capsule, TAKE 1 CAPSULE DAILY, Disp: 90 capsule, Rfl: 0   fluticasone (FLONASE) 50 MCG/ACT nasal spray, , Disp: , Rfl:    fluticasone-salmeterol (ADVAIR HFA) 45-21 MCG/ACT inhaler, Inhale 2 puffs into the lungs 2 (two) times daily., Disp: 1 each, Rfl: 12   levothyroxine (SYNTHROID) 50 MCG tablet, Take 1 tablet (50 mcg total) by mouth daily before breakfast., Disp: 90 tablet, Rfl: 0   losartan (COZAAR) 25 MG tablet, Take 1 tablet (25 mg total) by mouth daily., Disp: 90 tablet, Rfl: 3   metFORMIN (GLUCOPHAGE) 500 MG tablet, Take 1 tablet (500 mg total) by mouth 2 (two) times daily with a meal., Disp: 180 tablet, Rfl: 1   Multiple Vitamin (MULTIVITAMIN ADULT PO), Take 1 capsule by mouth  daily., Disp: , Rfl:    omeprazole (PRILOSEC) 40 MG capsule, Take 1 capsule (40 mg total) by mouth daily., Disp: 90 capsule, Rfl: 1   Subjective:   PATIENT ID: Alexandria Franklin Border GENDER: female DOB: December 16, 1950, MRN: [id number]  Chief Complaint  Patient presents with   Follow-up    SOB with exertion, wheezing and prod cough with white sputum.     HPI  72 year old female  with history of hypertension and diabetes who presents to clinic for follow up on cough.  Patient was first seen by me on 03/28/2022 for the evaluation of chronic cough over the past two years. She had reported nasal congestion for which she has been on flonase and desloratadine. She feels the desloratadine has not been helpful to her symptoms. Since she last saw me, she's quit drinking cool-aid and feels her cough is somewhat improved. She continues to experience exertional dyspnea, and reports a cough when she exerts herself. Patient does report that she had reflux made worse when she lays flat on her side. She takes Omeprazole for her reflux disease. She has been taken off her ACEi and switched to an ARB.   She reports that this wheezing is sporadic and that her shortness of breath is mostly exertional.  She does not feel short of breath at rest.  She does not recall any past instances where she has had shortness of breath or respiratory complaints.  She does not have any personal history of asthma or reactive airway disease and has never had to use an inhaler nor see a pulmonologist. We had prescribed Symbicort during her last visit that she did not fill because the pharmacy didn't have it.  Patient reports being a never smoker and denies any occupational exposures.  She does not currently have any pets.  Ancillary information including prior medications, full medical/surgical/family/social histories, and PFTs (when available) are listed below and have been reviewed.   Review of Systems  Constitutional:  Negative for chills, fever and weight loss.  Respiratory:  Positive for cough, shortness of breath and wheezing. Negative for hemoptysis and sputum production.   Cardiovascular:  Negative for chest pain.  Skin:  Negative for itching and rash.     Objective:   Vitals:   07/05/22 0936  BP: 120/70  Pulse: 81  Temp: 97.7 F (36.5 C)  TempSrc: Temporal  SpO2: 94%  Weight: 210 lb (95.3 kg)   Height: 5\' 2"  (1.575 m)   94% on RA BMI Readings from Last 3 Encounters:  07/05/22 38.41 kg/m  06/27/22 38.41 kg/m  05/11/22 37.86 kg/m   Wt Readings from Last 3 Encounters:  07/05/22 210 lb (95.3 kg)  06/27/22 210 lb (95.3 kg)  05/11/22 207 lb (93.9 kg)    Physical Exam Constitutional:      General: She is not in acute distress.    Appearance: Normal appearance. She is not ill-appearing.  HENT:     Head: Normocephalic.     Nose: Nose normal.     Mouth/Throat:     Mouth: Mucous membranes are moist.  Eyes:     Extraocular Movements: Extraocular movements intact.     Pupils: Pupils are equal, round, and reactive to light.  Cardiovascular:     Rate and Rhythm: Normal rate and regular rhythm.     Pulses: Normal pulses.     Heart sounds: Normal heart sounds.  Pulmonary:     Effort: Pulmonary effort is normal.     Breath sounds: Normal breath  sounds. No wheezing or rales.  Abdominal:     General: Abdomen is flat.     Palpations: Abdomen is soft.  Musculoskeletal:        General: Normal range of motion.     Cervical back: Normal range of motion.  Skin:    General: Skin is warm.  Neurological:     General: No focal deficit present.     Mental Status: She is alert and oriented to person, place, and time. Mental status is at baseline.       Ancillary Information    Past Medical History:  Diagnosis Date   Allergy    Diabetes    Essential hypertension    GERD (gastroesophageal reflux disease)    Rheumatoid arthritis    Thyroid disease      Family History  Problem Relation Age of Onset   Cancer Mother    Thyroid disease Father    Diabetes Father    Mental illness Sister    Breast cancer Neg Hx      Past Surgical History:  Procedure Laterality Date   ABDOMINAL HYSTERECTOMY  1991   CHOLECYSTECTOMY, LAPAROSCOPIC  1980    Social History   Socioeconomic History   Marital status: Widowed    Spouse name: Not on file   Number of children: 1   Years  of education: 14   Highest education level: Associate degree: occupational, Scientist, product/process development, or vocational program  Occupational History   Not on file  Tobacco Use   Smoking status: Never    Passive exposure: Never   Smokeless tobacco: Never  Vaping Use   Vaping Use: Never used  Substance and Sexual Activity   Alcohol use: Not Currently   Drug use: Never   Sexual activity: Not Currently  Other Topics Concern   Not on file  Social History Narrative   Pt lives alone    Social Determinants of Health   Financial Resource Strain: Low Risk  (06/27/2022)   Overall Financial Resource Strain (CARDIA)    Difficulty of Paying Living Expenses: Not hard at all  Food Insecurity: No Food Insecurity (06/27/2022)   Hunger Vital Sign    Worried About Running Out of Food in the Last Year: Never true    Ran Out of Food in the Last Year: Never true  Transportation Needs: No Transportation Needs (06/27/2022)   PRAPARE - Administrator, Civil Service (Medical): No    Lack of Transportation (Non-Medical): No  Physical Activity: Inactive (05/11/2022)   Exercise Vital Sign    Days of Exercise per Week: 0 days    Minutes of Exercise per Session: 0 min  Stress: No Stress Concern Present (05/11/2022)   [employer] of Occupational Health - Occupational Stress Questionnaire    Feeling of Stress : Not at all  Social Connections: Moderately Isolated (05/11/2022)   Social Connection and Isolation Panel [NHANES]    Frequency of Communication with Friends and Family: More than three times a week    Frequency of Social Gatherings with Friends and Family: Twice a week    Attends Religious Services: More than 4 times per year    Active Member of [employer] or Organizations: No    Attends Banker Meetings: Never    Marital Status: Widowed  Intimate Partner Violence: Not At Risk (06/27/2022)   Humiliation, Afraid, Rape, and Kick questionnaire    Fear of Current or Ex-Partner: No    Emotionally  Abused: No    Physically  Abused: No    Sexually Abused: No     Allergies  Allergen Reactions   Lisinopril Cough   Codeine Nausea Only     CBC    Component Value Date/Time   WBC 10.6 06/27/2022 1017   RBC 4.56 06/27/2022 1017   RBC 4.67 07/22/2019 0000   HGB 13.6 06/27/2022 1017   HCT 41.1 06/27/2022 1017   PLT 426 06/27/2022 1017   MCV 90 06/27/2022 1017   MCH 29.8 06/27/2022 1017   MCHC 33.1 06/27/2022 1017   RDW 13.6 06/27/2022 1017   LYMPHSABS 3.6 (H) 06/27/2022 1017   EOSABS 0.4 06/27/2022 1017   BASOSABS 0.1 06/27/2022 1017    Pulmonary Functions Testing Results:    Latest Ref Rng & Units 06/23/2022    3:08 PM  PFT Results  FVC-Pre L 1.83   FVC-Predicted Pre % 69   FVC-Post L 1.95   FVC-Predicted Post % 73   Pre FEV1/FVC % % 74   Post FEV1/FCV % % 58   FEV1-Pre L 1.35   FEV1-Predicted Pre % 68   FEV1-Post L 1.12   DLCO uncorrected ml/min/mmHg 14.69   DLCO UNC% % 81   DLVA Predicted % 105   TLC L 4.33   TLC % Predicted % 91   RV % Predicted % 114     Outpatient Medications Prior to Visit  Medication Sig Dispense Refill   allopurinol (ZYLOPRIM) 100 MG tablet TAKE 1 TABLET DAILY 90 tablet 0   atorvastatin (LIPITOR) 20 MG tablet Take 1 tablet (20 mg total) by mouth daily. 90 tablet 1   azelastine (ASTELIN) 0.1 % nasal spray      celecoxib (CELEBREX) 200 MG capsule TAKE 1 CAPSULE DAILY 90 capsule 0   fluticasone (FLONASE) 50 MCG/ACT nasal spray      levothyroxine (SYNTHROID) 50 MCG tablet Take 1 tablet (50 mcg total) by mouth daily before breakfast. 90 tablet 0   losartan (COZAAR) 25 MG tablet Take 1 tablet (25 mg total) by mouth daily. 90 tablet 3   metFORMIN (GLUCOPHAGE) 500 MG tablet Take 1 tablet (500 mg total) by mouth 2 (two) times daily with a meal. 180 tablet 1   Multiple Vitamin (MULTIVITAMIN ADULT PO) Take 1 capsule by mouth daily.     omeprazole (PRILOSEC) 40 MG capsule Take 1 capsule (40 mg total) by mouth daily. 90 capsule 1   desloratadine  (CLARINEX) 5 MG tablet Take 1 tablet (5 mg total) by mouth daily. 30 tablet 11   No facility-administered medications prior to visit.

## 2022-07-08 ENCOUNTER — Telehealth: Payer: Self-pay

## 2022-07-08 NOTE — Telephone Encounter (Signed)
Can you please check with the patient's insurance and see what alternative inhalers they will cover?  Thank you!

## 2022-07-08 NOTE — Telephone Encounter (Signed)
PA request received via fax for Advair HFA  Unable to complete form due to the patient not having tried any alternatives per chart.  Please see PA form attached in patients documents and complete or change medication if appropriate.

## 2022-07-11 ENCOUNTER — Other Ambulatory Visit (HOSPITAL_COMMUNITY): Payer: Self-pay

## 2022-07-11 NOTE — Telephone Encounter (Signed)
Per test claims generic Symbicort and generic Advair Diskus are covered at this time.

## 2022-07-12 MED ORDER — BUDESONIDE-FORMOTEROL FUMARATE 160-4.5 MCG/ACT IN AERO: 2.0000 | INHALATION_SPRAY | Freq: Two times a day (BID) | RESPIRATORY_TRACT | 3 refills | Status: DC

## 2022-07-12 NOTE — Telephone Encounter (Signed)
ATC the patient. LVM for patient to return my call. 

## 2022-07-12 NOTE — Telephone Encounter (Signed)
I have notified the patient and [facility] the [facility].  Nothing further needed.

## 2022-07-12 NOTE — Telephone Encounter (Signed)
PT ret Kristina's call. Pls call again.

## 2022-07-12 NOTE — Telephone Encounter (Signed)
Dr. Aundria Rud please advise.

## 2022-07-14 DIAGNOSIS — E119 Type 2 diabetes mellitus without complications: Secondary | ICD-10-CM | POA: Diagnosis not present

## 2022-07-14 DIAGNOSIS — H1045 Other chronic allergic conjunctivitis: Secondary | ICD-10-CM | POA: Diagnosis not present

## 2022-07-14 DIAGNOSIS — H04123 Dry eye syndrome of bilateral lacrimal glands: Secondary | ICD-10-CM | POA: Diagnosis not present

## 2022-07-14 DIAGNOSIS — H2513 Age-related nuclear cataract, bilateral: Secondary | ICD-10-CM | POA: Diagnosis not present

## 2022-08-02 ENCOUNTER — Other Ambulatory Visit: Payer: Self-pay | Admitting: Internal Medicine

## 2022-08-02 DIAGNOSIS — E118 Type 2 diabetes mellitus with unspecified complications: Secondary | ICD-10-CM

## 2022-08-03 ENCOUNTER — Other Ambulatory Visit: Payer: Self-pay | Admitting: Internal Medicine

## 2022-08-03 DIAGNOSIS — K219 Gastro-esophageal reflux disease without esophagitis: Secondary | ICD-10-CM

## 2022-08-03 DIAGNOSIS — E039 Hypothyroidism, unspecified: Secondary | ICD-10-CM

## 2022-08-03 DIAGNOSIS — E1169 Type 2 diabetes mellitus with other specified complication: Secondary | ICD-10-CM

## 2022-08-25 DIAGNOSIS — E039 Hypothyroidism, unspecified: Secondary | ICD-10-CM | POA: Diagnosis not present

## 2022-08-25 DIAGNOSIS — R809 Proteinuria, unspecified: Secondary | ICD-10-CM | POA: Diagnosis not present

## 2022-08-25 DIAGNOSIS — E1129 Type 2 diabetes mellitus with other diabetic kidney complication: Secondary | ICD-10-CM | POA: Diagnosis not present

## 2022-08-25 DIAGNOSIS — R1314 Dysphagia, pharyngoesophageal phase: Secondary | ICD-10-CM | POA: Diagnosis not present

## 2022-08-25 DIAGNOSIS — I10 Essential (primary) hypertension: Secondary | ICD-10-CM | POA: Diagnosis not present

## 2022-08-25 DIAGNOSIS — K219 Gastro-esophageal reflux disease without esophagitis: Secondary | ICD-10-CM | POA: Diagnosis not present

## 2022-08-25 DIAGNOSIS — Z1211 Encounter for screening for malignant neoplasm of colon: Secondary | ICD-10-CM | POA: Diagnosis not present

## 2022-08-25 DIAGNOSIS — R053 Chronic cough: Secondary | ICD-10-CM | POA: Diagnosis not present

## 2022-09-06 ENCOUNTER — Other Ambulatory Visit: Payer: Self-pay | Admitting: Internal Medicine

## 2022-09-06 DIAGNOSIS — E790 Hyperuricemia without signs of inflammatory arthritis and tophaceous disease: Secondary | ICD-10-CM

## 2022-09-06 DIAGNOSIS — M170 Bilateral primary osteoarthritis of knee: Secondary | ICD-10-CM

## 2022-09-12 ENCOUNTER — Other Ambulatory Visit: Payer: Self-pay | Admitting: Internal Medicine

## 2022-09-12 DIAGNOSIS — R809 Proteinuria, unspecified: Secondary | ICD-10-CM

## 2022-09-19 ENCOUNTER — Ambulatory Visit: Payer: Medicare PPO

## 2022-09-19 DIAGNOSIS — K2970 Gastritis, unspecified, without bleeding: Secondary | ICD-10-CM | POA: Diagnosis not present

## 2022-09-19 DIAGNOSIS — K219 Gastro-esophageal reflux disease without esophagitis: Secondary | ICD-10-CM | POA: Diagnosis not present

## 2022-09-19 DIAGNOSIS — R1314 Dysphagia, pharyngoesophageal phase: Secondary | ICD-10-CM | POA: Diagnosis not present

## 2022-10-20 ENCOUNTER — Ambulatory Visit (INDEPENDENT_AMBULATORY_CARE_PROVIDER_SITE_OTHER): Payer: Medicare PPO | Admitting: Internal Medicine

## 2022-10-20 ENCOUNTER — Encounter: Payer: Self-pay | Admitting: Internal Medicine

## 2022-10-20 VITALS — BP 126/70 | HR 66 | Ht 62.0 in | Wt 204.0 lb

## 2022-10-20 DIAGNOSIS — Z7984 Long term (current) use of oral hypoglycemic drugs: Secondary | ICD-10-CM

## 2022-10-20 DIAGNOSIS — K219 Gastro-esophageal reflux disease without esophagitis: Secondary | ICD-10-CM

## 2022-10-20 DIAGNOSIS — I10 Essential (primary) hypertension: Secondary | ICD-10-CM | POA: Diagnosis not present

## 2022-10-20 DIAGNOSIS — R053 Chronic cough: Secondary | ICD-10-CM | POA: Diagnosis not present

## 2022-10-20 DIAGNOSIS — E118 Type 2 diabetes mellitus with unspecified complications: Secondary | ICD-10-CM

## 2022-10-20 DIAGNOSIS — Z1231 Encounter for screening mammogram for malignant neoplasm of breast: Secondary | ICD-10-CM | POA: Diagnosis not present

## 2022-10-20 LAB — POCT GLYCOSYLATED HEMOGLOBIN (HGB A1C): Hemoglobin A1C: 6.3 % — AB (ref 4.0–5.6)

## 2022-10-20 NOTE — Patient Instructions (Signed)
Call [facility] to schedule your mammogram at [phone].  

## 2022-10-20 NOTE — Assessment & Plan Note (Addendum)
Blood sugars stable without hypoglycemic symptoms or events. Current regimen is metformin. Changes made last visit are none. Lab Results  Component Value Date   HGBA1C 6.3 (H) 06/27/2022  A1C today = 6.3.  continue current regimen. Will obtain eye exam report.

## 2022-10-20 NOTE — Assessment & Plan Note (Signed)
Mostly in the AM with phlegm production No shortness of breath or wheezing GI eval - EGD only showed gastritis Pulmonary  - no cause found, no benefit from inhalers ENT eval pending

## 2022-10-20 NOTE — Assessment & Plan Note (Signed)
Normal exam with stable BP on Losartan. No concerns or side effects to current medication. No change in regimen; continue low sodium diet.

## 2022-10-20 NOTE — Progress Notes (Signed)
Date:  10/20/2022   Name:  Alexandria Franklin   DOB:  May 07, 1950   MRN:  [id number]   Chief Complaint: Hypertension and Diabetes (Dr. Julianne Rice for eye exam will request)  Diabetes Pertinent negatives for hypoglycemia include no headaches or tremors. Pertinent negatives for diabetes include no chest pain, no fatigue, no polydipsia and no polyuria.  Hypertension This is a chronic problem. The problem is controlled. Pertinent negatives include no chest pain, headaches, palpitations or shortness of breath.  Chronic cough - she has had evaluation by GI and Pulmonary with no cause found.  Seeing ENT next.    Lab Results  Component Value Date   NA 141 06/27/2022   K 4.9 06/27/2022   CO2 20 06/27/2022   GLUCOSE 108 (H) 06/27/2022   BUN 14 06/27/2022   CREATININE 0.97 06/27/2022   CALCIUM 9.8 06/27/2022   EGFR 62 06/27/2022   GFRNONAA 53 01/23/2020   Lab Results  Component Value Date   CHOL 142 06/27/2022   HDL 48 06/27/2022   LDLCALC 65 06/27/2022   TRIG 172 (H) 06/27/2022   CHOLHDL 3.0 06/27/2022   Lab Results  Component Value Date   TSH 2.420 06/27/2022   Lab Results  Component Value Date   HGBA1C 6.3 (A) 10/20/2022   Lab Results  Component Value Date   WBC 10.6 06/27/2022   HGB 13.6 06/27/2022   HCT 41.1 06/27/2022   MCV 90 06/27/2022   PLT 426 06/27/2022   Lab Results  Component Value Date   ALT 13 06/27/2022   AST 12 06/27/2022   ALKPHOS 101 06/27/2022   BILITOT 0.6 06/27/2022   No results found for: "25OHVITD2", "25OHVITD3", "VD25OH"   Review of Systems  Constitutional:  Negative for appetite change, fatigue, fever and unexpected weight change.  HENT:  Negative for tinnitus and trouble swallowing.   Eyes:  Negative for visual disturbance.  Respiratory:  Positive for cough. Negative for chest tightness and shortness of breath.   Cardiovascular:  Negative for chest pain, palpitations and leg swelling.  Gastrointestinal:  Negative for abdominal pain.   Endocrine: Negative for polydipsia and polyuria.  Genitourinary:  Negative for dysuria and hematuria.  Musculoskeletal:  Negative for arthralgias.  Neurological:  Negative for tremors, numbness and headaches.  Psychiatric/Behavioral:  Negative for dysphoric mood.     Patient Active Problem List   Diagnosis Date Noted   Stage 3a chronic kidney disease (HCC) 10/25/2021   BMI 38.0-38.9,adult 06/09/2021   Chronic cough 06/09/2021   Hyperlipidemia associated with type 2 diabetes mellitus (HCC) 10/07/2020   Acquired hypothyroidism 09/30/2020   Asymptomatic hyperuricemia 09/30/2020   Diabetes mellitus with microalbuminuria (HCC) 09/30/2020   Type II diabetes mellitus with complication (HCC) 09/23/2020   Essential hypertension 09/23/2020   GERD (gastroesophageal reflux disease) 09/23/2020   Osteoarthritis of both knees 09/23/2020    Allergies  Allergen Reactions   Lisinopril Cough   Codeine Nausea Only    Past Surgical History:  Procedure Laterality Date   ABDOMINAL HYSTERECTOMY  1991   CHOLECYSTECTOMY, LAPAROSCOPIC  1980    Social History   Tobacco Use   Smoking status: Never    Passive exposure: Never   Smokeless tobacco: Never  Vaping Use   Vaping status: Never Used  Substance Use Topics   Alcohol use: Not Currently   Drug use: Never     Medication list has been reviewed and updated.  Current Meds  Medication Sig   allopurinol (ZYLOPRIM) 100 MG tablet TAKE  1 TABLET DAILY   atorvastatin (LIPITOR) 20 MG tablet Take 1 tablet (20 mg total) by mouth daily.   azelastine (ASTELIN) 0.1 % nasal spray    budesonide-formoterol (SYMBICORT) 160-4.5 MCG/ACT inhaler Inhale 2 puffs into the lungs in the morning and at bedtime.   celecoxib (CELEBREX) 200 MG capsule TAKE 1 CAPSULE DAILY   fluticasone (FLONASE) 50 MCG/ACT nasal spray    levothyroxine (SYNTHROID) 50 MCG tablet TAKE 1 TABLET DAILY BEFORE BREAKFAST   losartan (COZAAR) 25 MG tablet TAKE 1 TABLET DAILY   metFORMIN  (GLUCOPHAGE) 500 MG tablet TAKE 1 TABLET TWICE A DAY WITH MEALS   Multiple Vitamin (MULTIVITAMIN ADULT PO) Take 1 capsule by mouth daily.   omeprazole (PRILOSEC) 40 MG capsule TAKE 1 CAPSULE DAILY       10/20/2022    8:16 AM 06/27/2022    9:45 AM 02/25/2022    9:10 AM 10/25/2021   11:04 AM  GAD 7 : Generalized Anxiety Score  Nervous, Anxious, on Edge 0 0 0 0  Control/stop worrying 0 0 0 0  Worry too much - different things 0 0 1 1  Trouble relaxing 0 0 0 0  Restless 0 0 0 0  Easily annoyed or irritable 0 0 0 0  Afraid - awful might happen 0 0 0 0  Total GAD 7 Score 0 0 1 1  Anxiety Difficulty Not difficult at all Not difficult at all Not difficult at all Not difficult at all       10/20/2022    8:15 AM 06/27/2022    9:45 AM 05/11/2022    1:04 PM  Depression screen PHQ 2/9  Decreased Interest 1 0 0  Down, Depressed, Hopeless 0 0 0  PHQ - 2 Score 1 0 0  Altered sleeping 1 1 0  Tired, decreased energy 2 2 0  Change in appetite 0 0 0  Feeling bad or failure about yourself  0 0 0  Trouble concentrating 0 0 0  Moving slowly or fidgety/restless 0 1 0  Suicidal thoughts 0 0 0  PHQ-9 Score 4 4 0  Difficult doing work/chores Not difficult at all Not difficult at all Not difficult at all    BP Readings from Last 3 Encounters:  10/20/22 126/70  07/05/22 120/70  06/27/22 128/74    Physical Exam Vitals and nursing note reviewed.  Constitutional:      General: She is not in acute distress.    Appearance: She is well-developed. She is obese.  HENT:     Head: Normocephalic and atraumatic.  Neck:     Vascular: No carotid bruit.  Cardiovascular:     Rate and Rhythm: Normal rate and regular rhythm.     Heart sounds: No murmur heard. Pulmonary:     Effort: Pulmonary effort is normal. No respiratory distress.     Breath sounds: No wheezing or rhonchi.  Musculoskeletal:     Cervical back: Normal range of motion.     Right lower leg: No edema.     Left lower leg: No edema.   Lymphadenopathy:     Cervical: No cervical adenopathy.  Skin:    General: Skin is warm and dry.     Findings: No rash.  Neurological:     General: No focal deficit present.     Mental Status: She is alert and oriented to person, place, and time.  Psychiatric:        Mood and Affect: Mood normal.  Behavior: Behavior normal.     Wt Readings from Last 3 Encounters:  10/20/22 204 lb (92.5 kg)  07/05/22 210 lb (95.3 kg)  06/27/22 210 lb (95.3 kg)    BP 126/70   Pulse 66   Wt 204 lb (92.5 kg)   SpO2 96%   BMI 37.31 kg/m   Assessment and Plan:  Problem List Items Addressed This Visit       Unprioritized   Type II diabetes mellitus with complication (HCC) (Chronic)    Blood sugars stable without hypoglycemic symptoms or events. Current regimen is metformin. Changes made last visit are none. Lab Results  Component Value Date   HGBA1C 6.3 (H) 06/27/2022  A1C today = 6.3.  continue current regimen. Will obtain eye exam report.       Relevant Orders   POCT glycosylated hemoglobin (Hb A1C) (Completed)   GERD (gastroesophageal reflux disease) (Chronic)    Recent EGD showed normal esophagus, gastric erythema Omeprazole was increased to bid with no change in cough symptoms       Essential hypertension - Primary (Chronic)    Normal exam with stable BP on Losartan. No concerns or side effects to current medication. No change in regimen; continue low sodium diet.       Chronic cough    Mostly in the AM with phlegm production No shortness of breath or wheezing GI eval - EGD only showed gastritis Pulmonary  - no cause found, no benefit from inhalers ENT eval pending      Other Visit Diagnoses     Long term current use of oral hypoglycemic drug       Encounter for screening mammogram for breast cancer       Relevant Orders   MM 3D SCREENING MAMMOGRAM BILATERAL BREAST       Return in about 8 months (around 06/20/2023) for CPX.    Reubin Milan,  MD [facility] [place]

## 2022-10-20 NOTE — Assessment & Plan Note (Addendum)
Recent EGD showed normal esophagus, gastric erythema Omeprazole was increased to bid with no change in cough symptoms

## 2022-12-05 ENCOUNTER — Other Ambulatory Visit: Payer: Self-pay | Admitting: Internal Medicine

## 2022-12-05 DIAGNOSIS — E785 Hyperlipidemia, unspecified: Secondary | ICD-10-CM

## 2022-12-15 DIAGNOSIS — J310 Chronic rhinitis: Secondary | ICD-10-CM | POA: Diagnosis not present

## 2022-12-15 DIAGNOSIS — K219 Gastro-esophageal reflux disease without esophagitis: Secondary | ICD-10-CM | POA: Diagnosis not present

## 2022-12-15 DIAGNOSIS — J386 Stenosis of larynx: Secondary | ICD-10-CM | POA: Diagnosis not present

## 2022-12-28 DIAGNOSIS — K219 Gastro-esophageal reflux disease without esophagitis: Secondary | ICD-10-CM | POA: Diagnosis not present

## 2022-12-28 DIAGNOSIS — R490 Dysphonia: Secondary | ICD-10-CM | POA: Diagnosis not present

## 2022-12-28 DIAGNOSIS — J386 Stenosis of larynx: Secondary | ICD-10-CM | POA: Diagnosis not present

## 2023-01-28 NOTE — Telephone Encounter (Signed)
[facility] - send for records.

## 2023-02-15 IMAGING — MG MM DIGITAL SCREENING BILAT W/ TOMO AND CAD
6 of 10 series · 6 of 30 positions shown · non-contrast
Comparison: Previous exam(s).

CLINICAL DATA: Screening.

EXAM:
DIGITAL SCREENING BILATERAL MAMMOGRAM WITH TOMOSYNTHESIS AND CAD
TECHNIQUE: Bilateral screening digital craniocaudal and mediolateral oblique
mammograms were obtained. Bilateral screening digital breast
tomosynthesis was performed. The images were evaluated with
computer-aided detection.

[R CC synth-2D]
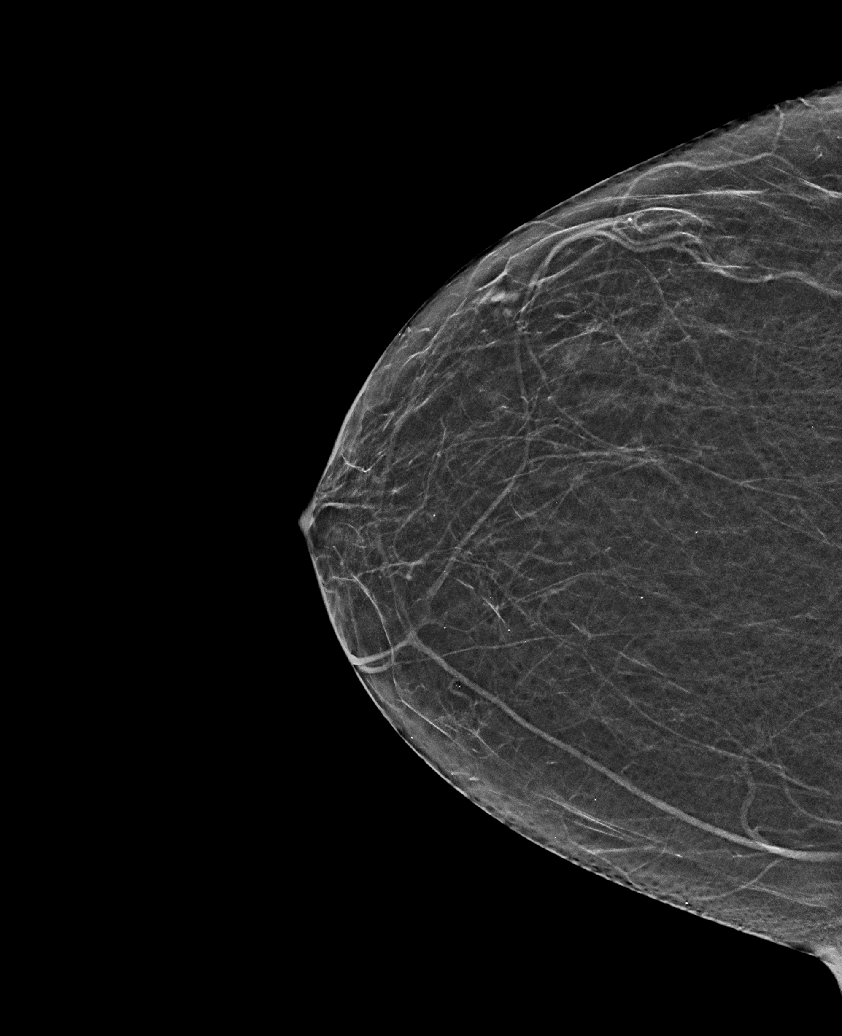

[L CC synth-2D]
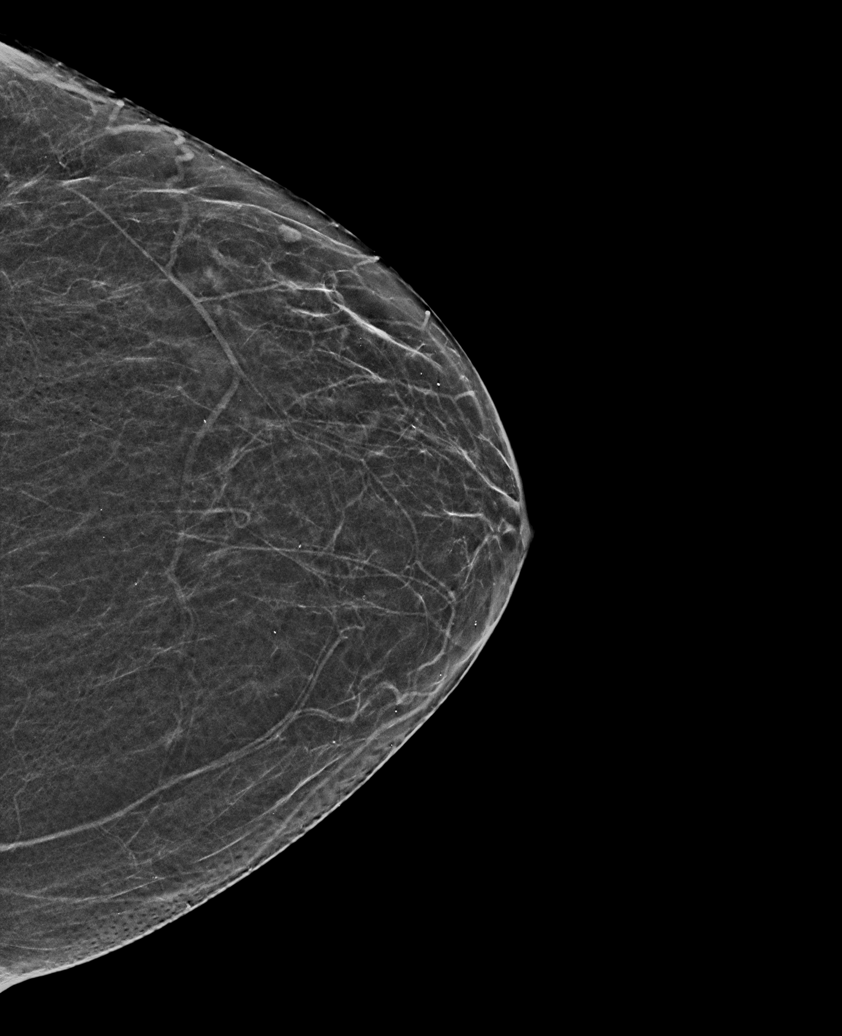

[L MLO synth-2D (1 of 2)]
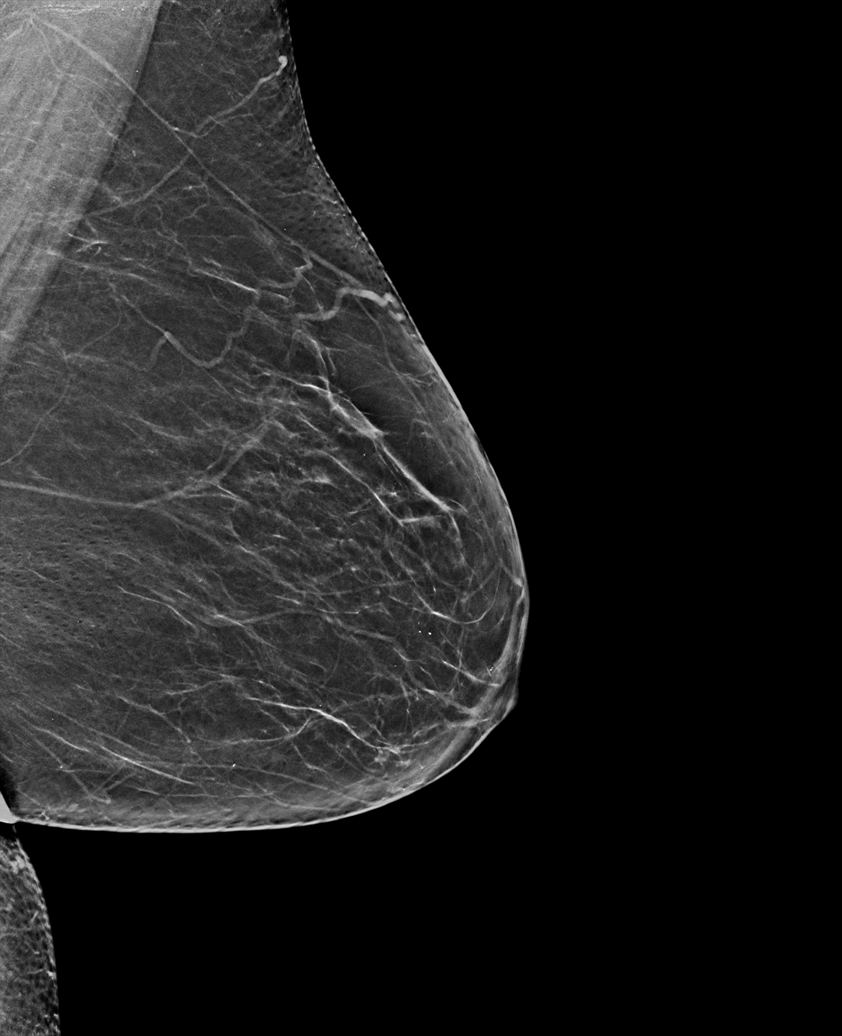

[L MLO synth-2D (2 of 2)]
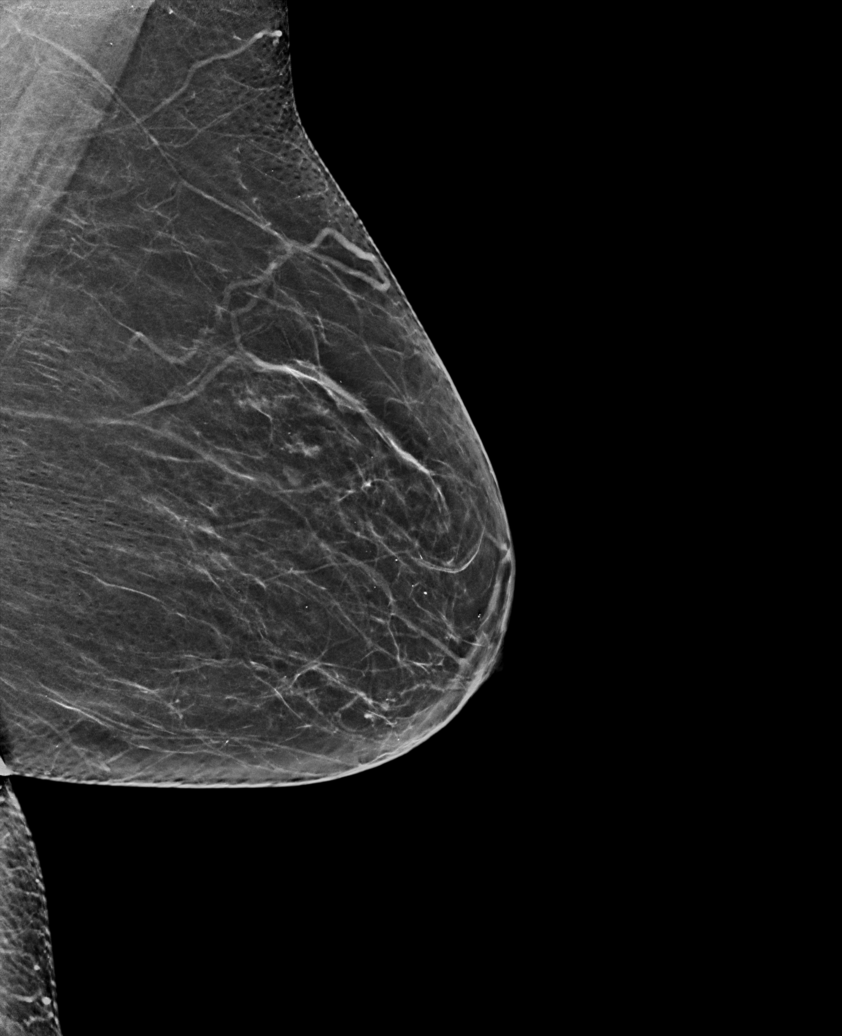

[R MLO synth-2D]
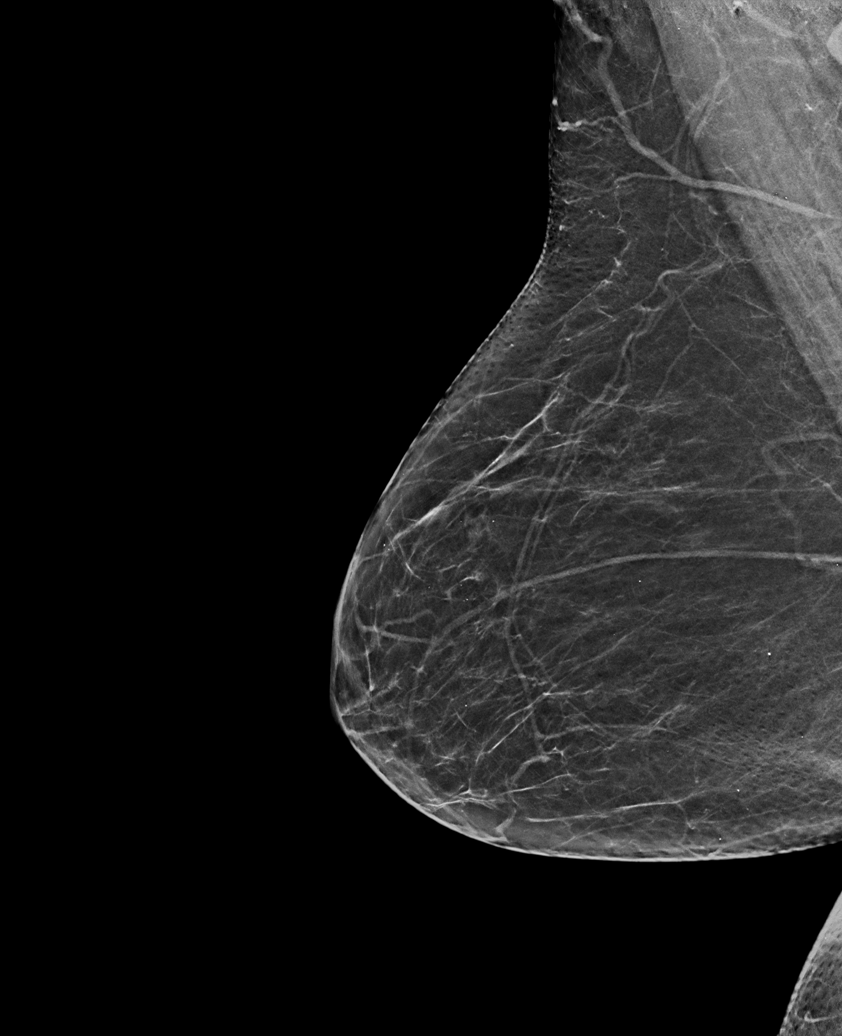

[L MLO tomo · tomo slice 30/59.0]
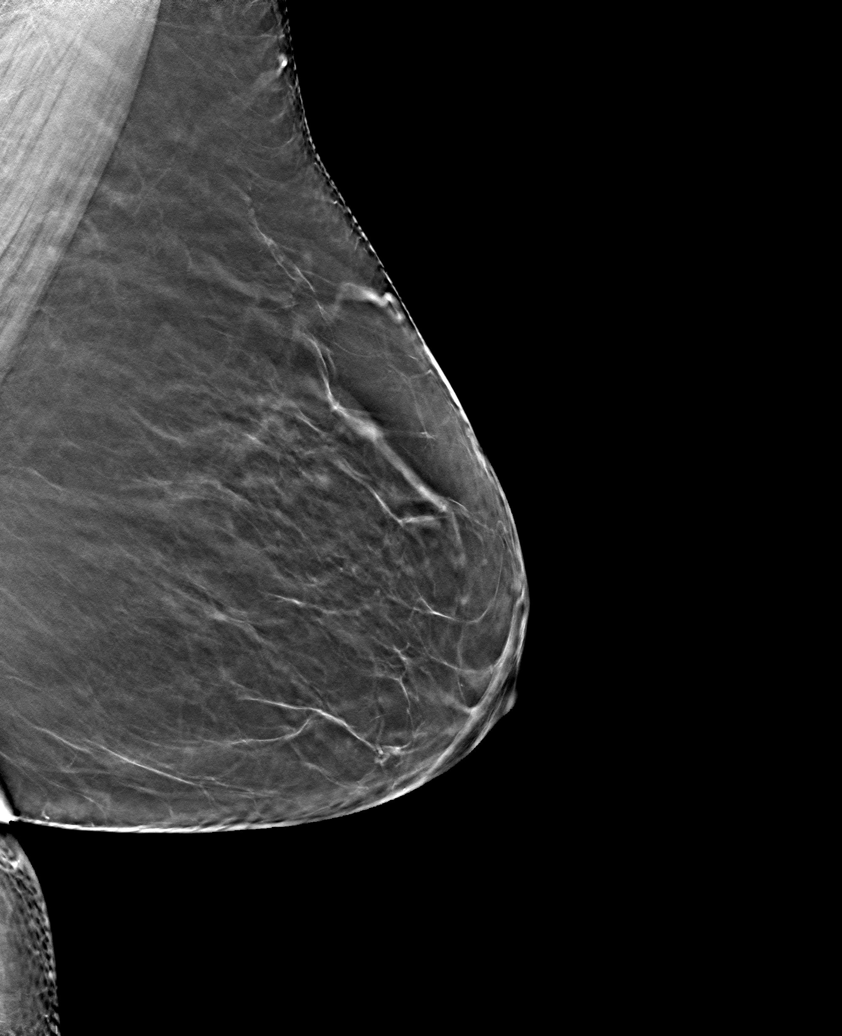

[6 of 30 positions shown; findings below may reference images not displayed]

ACR Breast Density Category b: There are scattered areas of
fibroglandular density.
FINDINGS: There are no findings suspicious for malignancy.
IMPRESSION: No mammographic evidence of malignancy. A result letter of this
screening mammogram will be mailed directly to the patient.

RECOMMENDATION:
Screening mammogram in one year. (Code:51-O-LD2)

BI-RADS CATEGORY  1: Negative.

## 2023-02-17 ENCOUNTER — Encounter: Payer: Self-pay | Admitting: Internal Medicine

## 2023-02-17 NOTE — Telephone Encounter (Signed)
 Care team updated and letter sent for eye exam notes.

## 2023-02-23 DIAGNOSIS — K219 Gastro-esophageal reflux disease without esophagitis: Secondary | ICD-10-CM | POA: Diagnosis not present

## 2023-02-23 DIAGNOSIS — N1831 Chronic kidney disease, stage 3a: Secondary | ICD-10-CM | POA: Diagnosis not present

## 2023-02-23 DIAGNOSIS — E118 Type 2 diabetes mellitus with unspecified complications: Secondary | ICD-10-CM | POA: Diagnosis not present

## 2023-02-23 DIAGNOSIS — E039 Hypothyroidism, unspecified: Secondary | ICD-10-CM | POA: Diagnosis not present

## 2023-02-23 DIAGNOSIS — Z1211 Encounter for screening for malignant neoplasm of colon: Secondary | ICD-10-CM | POA: Diagnosis not present

## 2023-02-27 DIAGNOSIS — I10 Essential (primary) hypertension: Secondary | ICD-10-CM | POA: Diagnosis not present

## 2023-02-27 DIAGNOSIS — Z01818 Encounter for other preprocedural examination: Secondary | ICD-10-CM | POA: Diagnosis not present

## 2023-02-27 DIAGNOSIS — E039 Hypothyroidism, unspecified: Secondary | ICD-10-CM | POA: Diagnosis not present

## 2023-02-27 DIAGNOSIS — E119 Type 2 diabetes mellitus without complications: Secondary | ICD-10-CM | POA: Diagnosis not present

## 2023-02-27 DIAGNOSIS — K219 Gastro-esophageal reflux disease without esophagitis: Secondary | ICD-10-CM | POA: Diagnosis not present

## 2023-02-27 DIAGNOSIS — E785 Hyperlipidemia, unspecified: Secondary | ICD-10-CM | POA: Diagnosis not present

## 2023-03-15 HISTORY — PX: OTHER SURGICAL HISTORY: SHX169

## 2023-03-17 DIAGNOSIS — E119 Type 2 diabetes mellitus without complications: Secondary | ICD-10-CM | POA: Diagnosis not present

## 2023-03-17 DIAGNOSIS — K219 Gastro-esophageal reflux disease without esophagitis: Secondary | ICD-10-CM | POA: Diagnosis not present

## 2023-03-17 DIAGNOSIS — E039 Hypothyroidism, unspecified: Secondary | ICD-10-CM | POA: Diagnosis not present

## 2023-03-17 DIAGNOSIS — Z7984 Long term (current) use of oral hypoglycemic drugs: Secondary | ICD-10-CM | POA: Diagnosis not present

## 2023-03-17 DIAGNOSIS — Z885 Allergy status to narcotic agent status: Secondary | ICD-10-CM | POA: Diagnosis not present

## 2023-03-17 DIAGNOSIS — J386 Stenosis of larynx: Secondary | ICD-10-CM | POA: Diagnosis not present

## 2023-03-17 DIAGNOSIS — Z79890 Hormone replacement therapy (postmenopausal): Secondary | ICD-10-CM | POA: Diagnosis not present

## 2023-03-17 DIAGNOSIS — E785 Hyperlipidemia, unspecified: Secondary | ICD-10-CM | POA: Diagnosis not present

## 2023-03-17 DIAGNOSIS — I10 Essential (primary) hypertension: Secondary | ICD-10-CM | POA: Diagnosis not present

## 2023-03-23 DIAGNOSIS — J386 Stenosis of larynx: Secondary | ICD-10-CM | POA: Diagnosis not present

## 2023-04-13 ENCOUNTER — Ambulatory Visit
Admission: RE | Admit: 2023-04-13 | Discharge: 2023-04-13 | Disposition: A | Discharge status: Home / Self Care | Payer: Medicare PPO | Source: Ambulatory Visit | Attending: Internal Medicine | Admitting: Internal Medicine

## 2023-04-13 DIAGNOSIS — Z1231 Encounter for screening mammogram for malignant neoplasm of breast: Secondary | ICD-10-CM | POA: Insufficient documentation

## 2023-04-24 ENCOUNTER — Ambulatory Visit: Payer: Medicare PPO

## 2023-04-24 DIAGNOSIS — K5730 Diverticulosis of large intestine without perforation or abscess without bleeding: Secondary | ICD-10-CM | POA: Diagnosis not present

## 2023-04-24 DIAGNOSIS — Z1211 Encounter for screening for malignant neoplasm of colon: Secondary | ICD-10-CM | POA: Diagnosis not present

## 2023-04-24 DIAGNOSIS — K640 First degree hemorrhoids: Secondary | ICD-10-CM | POA: Diagnosis not present

## 2023-04-24 DIAGNOSIS — K648 Other hemorrhoids: Secondary | ICD-10-CM | POA: Diagnosis not present

## 2023-05-23 LAB — HM DIABETES EYE EXAM

## 2023-06-15 ENCOUNTER — Ambulatory Visit: Payer: Medicare PPO | Admitting: Emergency Medicine

## 2023-06-15 VITALS — Ht 62.0 in | Wt 200.0 lb

## 2023-06-15 DIAGNOSIS — Z0000 Encounter for general adult medical examination without abnormal findings: Secondary | ICD-10-CM | POA: Diagnosis not present

## 2023-06-15 NOTE — Patient Instructions (Addendum)
 Alexandria Franklin , Thank you for taking time to come for your Medicare Wellness Visit. I appreciate your ongoing commitment to your health goals. Please review the following plan we discussed and let me know if I can assist you in the future.   Referrals/Orders/Follow-Ups/Clinician Recommendations: I have requested the results of you last diabetic eye exam from [facility]. You will be due for a diabetic foot exam and A1c at your next OV on 06/29/23 with Dr. Judithann Graves  This is a list of the screening recommended for you and due dates:  Health Maintenance  Topic Date Due   Pneumonia Vaccine (1 of 2 - PCV) Never done   Zoster (Shingles) Vaccine (1 of 2) Never done   Eye exam for diabetics  04/27/2022   COVID-19 Vaccine (1 - 2024-25 season) Never done   Hemoglobin A1C  04/22/2023   Yearly kidney function blood test for diabetes  06/27/2023   Yearly kidney health urinalysis for diabetes  06/27/2023   Complete foot exam   06/27/2023   Flu Shot  10/13/2023   Mammogram  04/12/2024   Medicare Annual Wellness Visit  06/14/2024   DEXA scan (bone density measurement)  04/10/2025   DTaP/Tdap/Td vaccine (2 - Td or Tdap) 04/05/2032   Colon Cancer Screening  04/24/2033   Hepatitis C Screening  Completed   HPV Vaccine  Aged Out    Advanced directives: (Declined) Advance directive discussed with you today. Even though you declined this today, please call our office should you change your mind, and we can give you the proper paperwork for you to fill out.  Next Medicare Annual Wellness Visit scheduled for next year: Yes, 06/27/24 @ 1:10pm (phone visit)

## 2023-06-15 NOTE — Progress Notes (Signed)
 Subjective:   Alexandria Franklin is a 73 y.o. who presents for a Medicare Wellness preventive visit.  Visit Complete: Virtual I connected with  Alexandria Franklin on 06/15/23 by a audio enabled telemedicine application and verified that I am speaking with the correct person using two identifiers.  Patient Location: Home  Provider Location: Home Office  I discussed the limitations of evaluation and management by telemedicine. The patient expressed understanding and agreed to proceed.  Vital Signs: Because this visit was a virtual/telehealth visit, some criteria may be missing or patient reported. Any vitals not documented were not able to be obtained and vitals that have been documented are patient reported.  VideoDeclined- This patient declined Librarian, academic. Therefore the visit was completed with audio only.  Persons Participating in Visit: Patient.  AWV Questionnaire: Yes: Patient Medicare AWV questionnaire was completed by the patient on 06/14/23; I have confirmed that all information answered by patient is correct and no changes since this date.  Cardiac Risk Factors include: advanced age (>41men, >91 women);hypertension;diabetes mellitus;obesity (BMI >30kg/m2)     Objective:    Today's Vitals   06/15/23 1309  Weight: 200 lb (90.7 kg)  Height: 5\' 2"  (1.575 m)   Body mass index is 36.58 kg/m.     06/15/2023    1:19 PM 05/11/2022    1:05 PM 05/05/2021    2:57 PM 02/08/2021    8:02 AM  Advanced Directives  Does Patient Have a Medical Advance Directive? No No No No  Would patient like information on creating a medical advance directive? No - Patient declined No - Patient declined No - Patient declined No - Patient declined    Current Medications (verified) Outpatient Encounter Medications as of 06/15/2023  Medication Sig   allopurinol (ZYLOPRIM) 100 MG tablet TAKE 1 TABLET DAILY   atorvastatin (LIPITOR) 20 MG tablet TAKE 1 TABLET DAILY    azelastine (ASTELIN) 0.1 % nasal spray    celecoxib (CELEBREX) 200 MG capsule TAKE 1 CAPSULE DAILY   fluticasone (FLONASE) 50 MCG/ACT nasal spray    levothyroxine (SYNTHROID) 50 MCG tablet TAKE 1 TABLET DAILY BEFORE BREAKFAST   losartan (COZAAR) 25 MG tablet TAKE 1 TABLET DAILY   metFORMIN (GLUCOPHAGE) 500 MG tablet TAKE 1 TABLET TWICE A DAY WITH MEALS   Multiple Vitamin (MULTIVITAMIN ADULT PO) Take 1 capsule by mouth daily.   omeprazole (PRILOSEC) 40 MG capsule TAKE 1 CAPSULE DAILY   budesonide-formoterol (SYMBICORT) 160-4.5 MCG/ACT inhaler Inhale 2 puffs into the lungs in the morning and at bedtime. (Patient not taking: Reported on 06/15/2023)   No facility-administered encounter medications on file as of 06/15/2023.    Allergies (verified) Lisinopril and Codeine   History: Past Medical History:  Diagnosis Date   Allergy    Diabetes (HCC)    Essential hypertension    GERD (gastroesophageal reflux disease)    Neuromuscular disorder (HCC)    Rheumatoid arthritis (HCC)    Thyroid disease    Past Surgical History:  Procedure Laterality Date   ABDOMINAL HYSTERECTOMY  1991   CHOLECYSTECTOMY     CHOLECYSTECTOMY, LAPAROSCOPIC  1980   TUBAL LIGATION     Family History  Problem Relation Age of Onset   Cancer Mother    Miscarriages / Stillbirths Mother    Obesity Mother    Thyroid disease Father    Diabetes Father    Mental illness Sister    Breast cancer Neg Hx    Social History   Socioeconomic  History   Marital status: Widowed    Spouse name: Not on file   Number of children: 1   Years of education: 14   Highest education level: Associate degree: occupational, Scientist, product/process development, or vocational program  Occupational History   Not on file  Tobacco Use   Smoking status: Never    Passive exposure: Never   Smokeless tobacco: Never  Vaping Use   Vaping status: Never Used  Substance and Sexual Activity   Alcohol use: Not Currently   Drug use: Never   Sexual activity: Not Currently   Other Topics Concern   Not on file  Social History Narrative   Pt lives alone    Social Drivers of Health   Financial Resource Strain: Low Risk  (06/15/2023)   Overall Financial Resource Strain (CARDIA)    Difficulty of Paying Living Expenses: Not very hard  Food Insecurity: No Food Insecurity (06/15/2023)   Hunger Vital Sign    Worried About Running Out of Food in the Last Year: Never true    Ran Out of Food in the Last Year: Never true  Transportation Needs: No Transportation Needs (06/15/2023)   PRAPARE - Administrator, Civil Service (Medical): No    Lack of Transportation (Non-Medical): No  Physical Activity: Inactive (06/15/2023)   Exercise Vital Sign    Days of Exercise per Week: 0 days    Minutes of Exercise per Session: 0 min  Stress: No Stress Concern Present (06/15/2023)   [employer] of Occupational Health - Occupational Stress Questionnaire    Feeling of Stress : Only a little  Social Connections: Moderately Integrated (06/15/2023)   Social Connection and Isolation Panel [NHANES]    Frequency of Communication with Friends and Family: More than three times a week    Frequency of Social Gatherings with Friends and Family: Twice a week    Attends Religious Services: More than 4 times per year    Active Member of [employer] or Organizations: Yes    Attends Banker Meetings: More than 4 times per year    Marital Status: Widowed    Tobacco Counseling Counseling given: Not Answered    Clinical Intake:  Pre-visit preparation completed: Yes  Pain : No/denies pain     BMI - recorded: 36.58 Nutritional Status: BMI > 30  Obese Nutritional Risks: None Diabetes: Yes CBG done?: No Did pt. bring in CBG monitor from home?: No  Lab Results  Component Value Date   HGBA1C 6.3 (A) 10/20/2022   HGBA1C 6.3 (H) 06/27/2022   HGBA1C 6.0 (A) 02/25/2022     How often do you need to have someone help you when you read instructions, pamphlets, or other  written materials from your doctor or pharmacy?: 1 - Never  Interpreter Needed?: No  Information entered by :: Tora Kindred, CMA   Activities of Daily Living     06/15/2023    1:11 PM  In your present state of health, do you have any difficulty performing the following activities:  Hearing? 0  Vision? 0  Difficulty concentrating or making decisions? 0  Walking or climbing stairs? 1  Comment climbing stairs due to arthritis in knees  Dressing or bathing? 0  Doing errands, shopping? 0  Preparing Food and eating ? N  Using the Toilet? N  In the past six months, have you accidently leaked urine? N  Do you have problems with loss of bowel control? N  Managing your Medications? N  Managing your Finances?  N  Housekeeping or managing your Housekeeping? N    Patient Care Team: Reubin Milan, MD as PCP - General (Internal Medicine) Goddard, [facility] Celene Kras, MD as Referring Physician (Otolaryngology) Buckmire, Elana Alm, MD as Referring Physician (Otolaryngology) Stanton Kidney, MD as Consulting Physician (Gastroenterology)  Indicate any recent Medical Services you may have received from other than [facility] providers in the past year (date may be approximate).     Assessment:   This is a routine wellness examination for Lessa.  Hearing/Vision screen Hearing Screening - Comments:: Denies hearing loss Vision Screening - Comments:: Gets diabetic eye exams, Dr. Julianne Rice Thatcher   Goals Addressed             This Visit's Progress    DIET - INCREASE WATER INTAKE         Depression Screen     06/15/2023    1:16 PM 10/20/2022    8:15 AM 06/27/2022    9:45 AM 05/11/2022    1:04 PM 02/25/2022    9:10 AM 10/25/2021   11:03 AM 06/09/2021   10:14 AM  PHQ 2/9 Scores  PHQ - 2 Score 0 1 0 0 2 2 0  PHQ- 9 Score 1 4 4  0 6 9 2     Fall Risk     06/15/2023    1:20 PM 10/20/2022    8:16 AM 06/27/2022    9:43 AM 05/11/2022    1:06 PM 02/25/2022    9:10 AM  Fall Risk    Falls in the past year? 0 0 0 0 0  Number falls in past yr: 0 0 0 0 0  Injury with Fall? 0 0 0 0 0  Risk for fall due to : No Fall Risks No Fall Risks No Fall Risks No Fall Risks No Fall Risks  Follow up Falls prevention discussed;Falls evaluation completed Falls evaluation completed Falls evaluation completed;Falls prevention discussed Falls prevention discussed;Falls evaluation completed Falls evaluation completed    MEDICARE RISK AT HOME:  Medicare Risk at Home Any stairs in or around the home?: Yes If so, are there any without handrails?: No Home free of loose throw rugs in walkways, pet beds, electrical cords, etc?: Yes Adequate lighting in your home to reduce risk of falls?: Yes Life alert?: No Use of a cane, walker or w/c?: No Grab bars in the bathroom?: No Shower chair or bench in shower?: Yes Elevated toilet seat or a handicapped toilet?: No  TIMED UP AND GO:  Was the test performed?  No  Cognitive Function: 6CIT completed        06/15/2023    1:21 PM 05/11/2022    1:11 PM  6CIT Screen  What Year? 0 points 0 points  What month? 0 points 0 points  What time? 0 points 0 points  Count back from 20 0 points 0 points  Months in reverse 0 points 0 points  Repeat phrase 0 points 0 points  Total Score 0 points 0 points    Immunizations Immunization History  Administered Date(s) Administered   Tdap 04/05/2022    Screening Tests Health Maintenance  Topic Date Due   Pneumonia Vaccine 33+ Years old (1 of 2 - PCV) Never done   Zoster Vaccines- Shingrix (1 of 2) Never done   OPHTHALMOLOGY EXAM  04/27/2022   COVID-19 Vaccine (1 - 2024-25 season) Never done   HEMOGLOBIN A1C  04/22/2023   Diabetic kidney evaluation - eGFR measurement  06/27/2023  Diabetic kidney evaluation - Urine ACR  06/27/2023   FOOT EXAM  06/27/2023   INFLUENZA VACCINE  10/13/2023   MAMMOGRAM  04/12/2024   Medicare Annual Wellness (AWV)  06/14/2024   DEXA SCAN  04/10/2025   DTaP/Tdap/Td (2 -  Td or Tdap) 04/05/2032   Colonoscopy  04/24/2033   Hepatitis C Screening  Completed   HPV VACCINES  Aged Out    Health Maintenance  Health Maintenance Due  Topic Date Due   Pneumonia Vaccine 41+ Years old (1 of 2 - PCV) Never done   Zoster Vaccines- Shingrix (1 of 2) Never done   OPHTHALMOLOGY EXAM  04/27/2022   COVID-19 Vaccine (1 - 2024-25 season) Never done   HEMOGLOBIN A1C  04/22/2023   Diabetic kidney evaluation - eGFR measurement  06/27/2023   Diabetic kidney evaluation - Urine ACR  06/27/2023   Health Maintenance Items Addressed: See Nurse Notes  Additional Screening:  Vision Screening: Recommended annual ophthalmology exams for early detection of glaucoma and other disorders of the eye.  Dental Screening: Recommended annual dental exams for proper oral hygiene  Community Resource Referral / Chronic Care Management: CRR required this visit?  No   CCM required this visit?  No     Plan:     I have personally reviewed and noted the following in the patient's chart:   Medical and social history Use of alcohol, tobacco or illicit drugs  Current medications and supplements including opioid prescriptions. Patient is not currently taking opioid prescriptions. Functional ability and status Nutritional status Physical activity Advanced directives List of other physicians Hospitalizations, surgeries, and ER visits in previous 12 months Vitals Screenings to include cognitive, depression, and falls Referrals and appointments  In addition, I have reviewed and discussed with patient certain preventive protocols, quality metrics, and best practice recommendations. A written personalized care plan for preventive services as well as general preventive health recommendations were provided to patient.     Tora Kindred, CMA   06/15/2023   After Visit Summary: (MyChart) Due to this being a telephonic visit, the after visit summary with patients personalized plan was offered  to patient via MyChart   Notes:  Requested results of last DM eye exam from Dr. Clydene Pugh.  Needs DM foot exam and A1c at next OV on 06/29/23. Last colonoscopy 04/25/23, no repeat per GI  Declined DM & Nutrition education Declined pneumonia, covid and shingles vaccines.

## 2023-06-29 ENCOUNTER — Encounter: Payer: Self-pay | Admitting: Internal Medicine

## 2023-07-10 ENCOUNTER — Ambulatory Visit (INDEPENDENT_AMBULATORY_CARE_PROVIDER_SITE_OTHER): Admitting: Internal Medicine

## 2023-07-10 ENCOUNTER — Other Ambulatory Visit: Payer: Self-pay | Admitting: Internal Medicine

## 2023-07-10 ENCOUNTER — Encounter: Payer: Self-pay | Admitting: Internal Medicine

## 2023-07-10 VITALS — BP 128/76 | HR 68 | Ht 62.0 in | Wt 202.0 lb

## 2023-07-10 DIAGNOSIS — E039 Hypothyroidism, unspecified: Secondary | ICD-10-CM

## 2023-07-10 DIAGNOSIS — E785 Hyperlipidemia, unspecified: Secondary | ICD-10-CM

## 2023-07-10 DIAGNOSIS — Z7984 Long term (current) use of oral hypoglycemic drugs: Secondary | ICD-10-CM

## 2023-07-10 DIAGNOSIS — E118 Type 2 diabetes mellitus with unspecified complications: Secondary | ICD-10-CM

## 2023-07-10 DIAGNOSIS — Z0000 Encounter for general adult medical examination without abnormal findings: Secondary | ICD-10-CM | POA: Diagnosis not present

## 2023-07-10 DIAGNOSIS — E1169 Type 2 diabetes mellitus with other specified complication: Secondary | ICD-10-CM | POA: Diagnosis not present

## 2023-07-10 DIAGNOSIS — K219 Gastro-esophageal reflux disease without esophagitis: Secondary | ICD-10-CM | POA: Diagnosis not present

## 2023-07-10 DIAGNOSIS — I10 Essential (primary) hypertension: Secondary | ICD-10-CM | POA: Diagnosis not present

## 2023-07-10 DIAGNOSIS — M62830 Muscle spasm of back: Secondary | ICD-10-CM | POA: Diagnosis not present

## 2023-07-10 DIAGNOSIS — N1831 Chronic kidney disease, stage 3a: Secondary | ICD-10-CM

## 2023-07-10 NOTE — Assessment & Plan Note (Signed)
 LDL is  Lab Results  Component Value Date   LDLCALC 65 06/27/2022   Current regimen is atorvastatin .  No medication side effects noted. Goal LDL is <70.

## 2023-07-10 NOTE — Assessment & Plan Note (Signed)
 Had consistent low GFR in the 50's for past 2 years. Last visit was normal. Will continue to monitor.

## 2023-07-10 NOTE — Assessment & Plan Note (Signed)
 Supplemented

## 2023-07-10 NOTE — Patient Instructions (Signed)
 We can try Cymbalta (Duloxetine) for diabetic neuropathic symptoms.

## 2023-07-10 NOTE — Progress Notes (Signed)
 Date:  07/10/2023   Name:  Alexandria Franklin   DOB:  Oct 18, 1950   MRN:  [id number]   Chief Complaint: Annual Exam and Gastroesophageal Reflux Alexandria Franklin is a 73 y.o. female who presents today for her Complete Annual Exam. She feels well. She reports exercising none. She reports she is sleeping fairly well. Breast complaints none.  Health Maintenance  Topic Date Due   Hemoglobin A1C  04/22/2023   Yearly kidney function blood test for diabetes  06/27/2023   Yearly kidney health urinalysis for diabetes  06/27/2023   COVID-19 Vaccine (1 - 2024-25 season) 07/26/2023*   Zoster (Shingles) Vaccine (1 of 2) 10/09/2023*   Pneumonia Vaccine (1 of 2 - PCV) 07/09/2024*   Flu Shot  10/13/2023   Mammogram  04/12/2024   Eye exam for diabetics  05/22/2024   Medicare Annual Wellness Visit  06/14/2024   Complete foot exam   07/09/2024   DEXA scan (bone density measurement)  04/10/2025   DTaP/Tdap/Td vaccine (2 - Td or Tdap) 04/05/2032   Colon Cancer Screening  04/24/2033   Hepatitis C Screening  Completed   HPV Vaccine  Aged Out   Meningitis B Vaccine  Aged Out  *Topic was postponed. The date shown is not the original due date.    Hypertension Pertinent negatives include no chest pain, headaches, palpitations or shortness of breath. Identifiable causes of hypertension include a thyroid problem.  Diabetes Pertinent negatives for hypoglycemia include no dizziness, headaches, nervousness/anxiousness or tremors. Pertinent negatives for diabetes include no chest pain, no fatigue, no polydipsia and no polyuria.  Hyperlipidemia Pertinent negatives include no chest pain or shortness of breath.  Thyroid Problem Patient reports no anxiety, constipation, diarrhea, fatigue, palpitations or tremors. Her past medical history is significant for hyperlipidemia.    Review of Systems  Constitutional:  Negative for chills, fatigue and fever.  HENT:  Negative for congestion, hearing loss and trouble  swallowing.   Eyes:  Negative for visual disturbance.  Respiratory:  Negative for cough, chest tightness, shortness of breath and wheezing.   Cardiovascular:  Negative for chest pain, palpitations and leg swelling.  Gastrointestinal:  Negative for abdominal pain, constipation, diarrhea and vomiting.       Requent gerd   Endocrine: Negative for polydipsia and polyuria.  Genitourinary:  Negative for dysuria, frequency, genital sores, vaginal bleeding and vaginal discharge.  Musculoskeletal:  Positive for arthralgias and back pain. Negative for gait problem and joint swelling.  Skin:  Negative for color change and rash.  Neurological:  Negative for dizziness, tremors, light-headedness and headaches.  Hematological:  Negative for adenopathy. Does not bruise/bleed easily.  Psychiatric/Behavioral:  Negative for dysphoric mood and sleep disturbance. The patient is not nervous/anxious.      Lab Results  Component Value Date   NA 141 06/27/2022   K 4.9 06/27/2022   CO2 20 06/27/2022   GLUCOSE 108 (H) 06/27/2022   BUN 14 06/27/2022   CREATININE 0.97 06/27/2022   CALCIUM  9.8 06/27/2022   EGFR 62 06/27/2022   GFRNONAA 53 01/23/2020   Lab Results  Component Value Date   CHOL 142 06/27/2022   HDL 48 06/27/2022   LDLCALC 65 06/27/2022   TRIG 172 (H) 06/27/2022   CHOLHDL 3.0 06/27/2022   Lab Results  Component Value Date   TSH 2.420 06/27/2022   Lab Results  Component Value Date   HGBA1C 6.3 (A) 10/20/2022   Lab Results  Component Value Date   WBC 10.6 06/27/2022  HGB 13.6 06/27/2022   HCT 41.1 06/27/2022   MCV 90 06/27/2022   PLT 426 06/27/2022   Lab Results  Component Value Date   ALT 13 06/27/2022   AST 12 06/27/2022   ALKPHOS 101 06/27/2022   BILITOT 0.6 06/27/2022   No results found for: "25OHVITD2", "25OHVITD3", "VD25OH"   Patient Active Problem List   Diagnosis Date Noted   Stage 3a chronic kidney disease (HCC) 10/25/2021   BMI 38.0-38.9,adult 06/09/2021    Chronic cough 06/09/2021   Hyperlipidemia associated with type 2 diabetes mellitus (HCC) 10/07/2020   Acquired hypothyroidism 09/30/2020   Asymptomatic hyperuricemia 09/30/2020   Obesity, morbid (HCC) 09/30/2020   Diabetes mellitus with microalbuminuria (HCC) 09/30/2020   Type II diabetes mellitus with complication (HCC) 09/23/2020   Essential hypertension 09/23/2020   GERD (gastroesophageal reflux disease) 09/23/2020   Osteoarthritis of both knees 09/23/2020    Allergies  Allergen Reactions   Lisinopril  Cough   Codeine Nausea Only    Past Surgical History:  Procedure Laterality Date   ABDOMINAL HYSTERECTOMY  1991   CHOLECYSTECTOMY     CHOLECYSTECTOMY, LAPAROSCOPIC  1980   PR LARYNGOSCOPY,DIRCT,OP SCOP,EXC TUMR  N/A 03/2023   TUBAL LIGATION      Social History   Tobacco Use   Smoking status: Never    Passive exposure: Never   Smokeless tobacco: Never  Vaping Use   Vaping status: Never Used  Substance Use Topics   Alcohol use: Not Currently   Drug use: Never     Medication list has been reviewed and updated.  Current Meds  Medication Sig   allopurinol  (ZYLOPRIM ) 100 MG tablet TAKE 1 TABLET DAILY   atorvastatin  (LIPITOR) 20 MG tablet TAKE 1 TABLET DAILY   azelastine (ASTELIN) 0.1 % nasal spray    celecoxib  (CELEBREX ) 200 MG capsule TAKE 1 CAPSULE DAILY   fluticasone  (FLONASE) 50 MCG/ACT nasal spray    levothyroxine  (SYNTHROID ) 50 MCG tablet TAKE 1 TABLET DAILY BEFORE BREAKFAST   losartan  (COZAAR ) 25 MG tablet TAKE 1 TABLET DAILY   metFORMIN  (GLUCOPHAGE ) 500 MG tablet TAKE 1 TABLET TWICE A DAY WITH MEALS   Multiple Vitamin (MULTIVITAMIN ADULT PO) Take 1 capsule by mouth daily.   omeprazole  (PRILOSEC) 40 MG capsule TAKE 1 CAPSULE DAILY       07/10/2023    1:21 PM 10/20/2022    8:16 AM 06/27/2022    9:45 AM 02/25/2022    9:10 AM  GAD 7 : Generalized Anxiety Score  Nervous, Anxious, on Edge 0 0 0 0  Control/stop worrying 0 0 0 0  Worry too much - different  things 0 0 0 1  Trouble relaxing 0 0 0 0  Restless 0 0 0 0  Easily annoyed or irritable 1 0 0 0  Afraid - awful might happen 0 0 0 0  Total GAD 7 Score 1 0 0 1  Anxiety Difficulty Not difficult at all Not difficult at all Not difficult at all Not difficult at all       07/10/2023    1:21 PM 06/15/2023    1:16 PM 10/20/2022    8:15 AM  Depression screen PHQ 2/9  Decreased Interest 0 0 1  Down, Depressed, Hopeless 0 0 0  PHQ - 2 Score 0 0 1  Altered sleeping 1 0 1  Tired, decreased energy 2 1 2   Change in appetite 0 0 0  Feeling bad or failure about yourself  0 0 0  Trouble concentrating 0 0 0  Moving slowly or fidgety/restless 0 0 0  Suicidal thoughts 0 0 0  PHQ-9 Score 3 1 4   Difficult doing work/chores Not difficult at all Not difficult at all Not difficult at all    BP Readings from Last 3 Encounters:  07/10/23 128/76  10/20/22 126/70  07/05/22 120/70    Physical Exam Vitals and nursing note reviewed.  Constitutional:      General: She is not in acute distress.    Appearance: She is well-developed.  HENT:     Head: Normocephalic and atraumatic.     Right Ear: Tympanic membrane and ear canal normal.     Left Ear: Tympanic membrane and ear canal normal.     Nose:     Right Sinus: No maxillary sinus tenderness.     Left Sinus: No maxillary sinus tenderness.  Eyes:     General: No scleral icterus.       Right eye: No discharge.        Left eye: No discharge.     Conjunctiva/sclera: Conjunctivae normal.  Neck:     Thyroid: No thyromegaly.     Vascular: No carotid bruit.  Cardiovascular:     Rate and Rhythm: Normal rate and regular rhythm.     Pulses: Normal pulses.     Heart sounds: Normal heart sounds.  Pulmonary:     Effort: Pulmonary effort is normal. No respiratory distress.     Breath sounds: No wheezing.  Abdominal:     General: Bowel sounds are normal.     Palpations: Abdomen is soft.     Tenderness: There is no abdominal tenderness.  Musculoskeletal:      Cervical back: Normal range of motion. No erythema.     Right lower leg: No edema.     Left lower leg: No edema.  Lymphadenopathy:     Cervical: No cervical adenopathy.  Skin:    General: Skin is warm and dry.     Capillary Refill: Capillary refill takes less than 2 seconds.     Findings: No rash.  Neurological:     General: No focal deficit present.     Mental Status: She is alert and oriented to person, place, and time.     Cranial Nerves: No cranial nerve deficit.     Sensory: No sensory deficit.     Deep Tendon Reflexes: Reflexes are normal and symmetric.     Comments: Tingling in feet in the evening  Psychiatric:        Attention and Perception: Attention normal.        Mood and Affect: Mood normal.        Behavior: Behavior normal.    Diabetic Foot Exam - Simple   Simple Foot Form Diabetic Foot exam was performed with the following findings: Yes 07/10/2023  1:39 PM  Visual Inspection No deformities, no ulcerations, no other skin breakdown bilaterally: Yes Sensation Testing See comments: Yes Pulse Check Posterior Tibialis and Dorsalis pulse intact bilaterally: Yes Comments Slight decrease in sensation dorsum of both feet      Wt Readings from Last 3 Encounters:  07/10/23 202 lb (91.6 kg)  06/15/23 200 lb (90.7 kg)  10/20/22 204 lb (92.5 kg)    BP 128/76   Pulse 68   Ht 5\' 2"  (1.575 m)   Wt 202 lb (91.6 kg)   SpO2 96%   BMI 36.95 kg/m   Assessment and Plan:  Problem List Items Addressed This Visit  Unprioritized   Type II diabetes mellitus with complication (HCC) (Chronic)   Blood sugars have been stable.  No recent hypoglycemic events requiring assistance. Currently medications are MTF.  She has some tingling but no pain in feet at bedtime. Lab Results  Component Value Date   HGBA1C 6.3 (A) 10/20/2022   Last visit no changes were made.       Relevant Orders   Comprehensive metabolic panel with GFR   Hemoglobin A1c   Microalbumin /  creatinine urine ratio   Essential hypertension (Chronic)   Blood pressure is well controlled.  Current medications are losartan . Will continue same regimen along with efforts to limit dietary sodium.       Relevant Orders   CBC with Differential/Platelet   GERD (gastroesophageal reflux disease) (Chronic)   Reflux symptoms are fairly well controlled on omeprazole  daily.  GI instructed her to use it bid which she has done occasionally. Patient denies red flag symptoms - no melena, weight loss, dysphagia.       Acquired hypothyroidism (Chronic)   Supplemented       Relevant Orders   TSH + free T4   Hyperlipidemia associated with type 2 diabetes mellitus (HCC) (Chronic)   LDL is  Lab Results  Component Value Date   LDLCALC 65 06/27/2022   Current regimen is atorvastatin .  No medication side effects noted. Goal LDL is <70.       Relevant Orders   Lipid panel   Obesity, morbid (HCC)   Continue to work on diet changes.      Stage 3a chronic kidney disease (HCC)   Had consistent low GFR in the 50's for past 2 years. Last visit was normal. Will continue to monitor.      Relevant Orders   Comprehensive metabolic panel with GFR   Other Visit Diagnoses       Annual physical exam    -  Primary   She declines all vaccines Mammogram done recently     Long term current use of oral hypoglycemic drug         Muscle spasm of back       recommend heat prn, sleep positioning with pillow between the knees continue Celebrex        Return in about 4 months (around 11/09/2023) for DM, HTN.    Sheron Dixons, MD [facility] Primary Care and Sports Medicine [place]

## 2023-07-10 NOTE — Assessment & Plan Note (Signed)
 Blood pressure is well controlled.  Current medications are losartan. Will continue same regimen along with efforts to limit dietary sodium.

## 2023-07-10 NOTE — Assessment & Plan Note (Addendum)
 Blood sugars have been stable.  No recent hypoglycemic events requiring assistance. Currently medications are MTF.  She has some tingling but no pain in feet at bedtime. Lab Results  Component Value Date   HGBA1C 6.3 (A) 10/20/2022   Last visit no changes were made.

## 2023-07-10 NOTE — Assessment & Plan Note (Signed)
 Continue to work on diet changes

## 2023-07-10 NOTE — Assessment & Plan Note (Signed)
 Reflux symptoms are fairly well controlled on omeprazole  daily.  GI instructed her to use it bid which she has done occasionally. Patient denies red flag symptoms - no melena, weight loss, dysphagia.

## 2023-07-11 LAB — CBC WITH DIFFERENTIAL/PLATELET
Basophils Absolute: 0.1 10*3/uL (ref 0.0–0.2)
Basos: 1 %
EOS (ABSOLUTE): 0.4 10*3/uL (ref 0.0–0.4)
Eos: 4 %
Hematocrit: 39.3 % (ref 34.0–46.6)
Hemoglobin: 13.3 g/dL (ref 11.1–15.9)
Immature Grans (Abs): 0 10*3/uL (ref 0.0–0.1)
Immature Granulocytes: 0 %
Lymphocytes Absolute: 3.2 10*3/uL — ABNORMAL HIGH (ref 0.7–3.1)
Lymphs: 34 %
MCH: 30.6 pg (ref 26.6–33.0)
MCHC: 33.8 g/dL (ref 31.5–35.7)
MCV: 90 fL (ref 79–97)
Monocytes Absolute: 0.5 10*3/uL (ref 0.1–0.9)
Monocytes: 5 %
Neutrophils Absolute: 5.2 10*3/uL (ref 1.4–7.0)
Neutrophils: 56 %
Platelets: 388 10*3/uL (ref 150–450)
RBC: 4.35 x10E6/uL (ref 3.77–5.28)
RDW: 14.1 % (ref 11.7–15.4)
WBC: 9.4 10*3/uL (ref 3.4–10.8)

## 2023-07-11 LAB — COMPREHENSIVE METABOLIC PANEL WITH GFR
ALT: 15 IU/L (ref 0–32)
AST: 18 IU/L (ref 0–40)
Albumin: 4.1 g/dL (ref 3.8–4.8)
Alkaline Phosphatase: 104 IU/L (ref 44–121)
BUN/Creatinine Ratio: 12 (ref 12–28)
BUN: 11 mg/dL (ref 8–27)
Bilirubin Total: 0.4 mg/dL (ref 0.0–1.2)
CO2: 22 mmol/L (ref 20–29)
Calcium: 9.4 mg/dL (ref 8.7–10.3)
Chloride: 104 mmol/L (ref 96–106)
Creatinine, Ser: 0.95 mg/dL (ref 0.57–1.00)
Globulin, Total: 2.5 g/dL (ref 1.5–4.5)
Glucose: 99 mg/dL (ref 70–99)
Potassium: 4.3 mmol/L (ref 3.5–5.2)
Sodium: 140 mmol/L (ref 134–144)
Total Protein: 6.6 g/dL (ref 6.0–8.5)
eGFR: 63 mL/min/{1.73_m2} (ref >59)

## 2023-07-11 LAB — LIPID PANEL
Chol/HDL Ratio: 3.7 ratio (ref 0.0–4.4)
Cholesterol, Total: 144 mg/dL (ref 100–199)
HDL: 39 mg/dL — ABNORMAL LOW (ref >39)
LDL Chol Calc (NIH): 73 mg/dL (ref 0–99)
Triglycerides: 188 mg/dL — ABNORMAL HIGH (ref 0–149)
VLDL Cholesterol Cal: 32 mg/dL (ref 5–40)

## 2023-07-11 LAB — HEMOGLOBIN A1C
Est. average glucose Bld gHb Est-mCnc: 137 mg/dL
Hgb A1c MFr Bld: 6.4 % — ABNORMAL HIGH (ref 4.8–5.6)

## 2023-07-11 LAB — TSH+FREE T4
Free T4: 1.17 ng/dL (ref 0.82–1.77)
TSH: 1.38 u[IU]/mL (ref 0.450–4.500)

## 2023-07-11 LAB — MICROALBUMIN / CREATININE URINE RATIO
Creatinine, Urine: 43.4 mg/dL
Microalb/Creat Ratio: <7 mg/g{creat} (ref 0–29)
Microalbumin, Urine: <3 ug/mL

## 2023-07-12 ENCOUNTER — Encounter: Payer: Self-pay | Admitting: Internal Medicine

## 2023-07-31 ENCOUNTER — Other Ambulatory Visit: Payer: Self-pay | Admitting: Internal Medicine

## 2023-07-31 DIAGNOSIS — E118 Type 2 diabetes mellitus with unspecified complications: Secondary | ICD-10-CM

## 2023-07-31 DIAGNOSIS — E039 Hypothyroidism, unspecified: Secondary | ICD-10-CM

## 2023-08-02 NOTE — Telephone Encounter (Signed)
 Requested Prescriptions  Pending Prescriptions Disp Refills   levothyroxine  (SYNTHROID ) 50 MCG tablet [Pharmacy Med Name: L-THYROXINE TABS 50MCG] 90 tablet 3    Sig: TAKE 1 TABLET DAILY BEFORE BREAKFAST     Endocrinology:  Hypothyroid Agents Passed - 08/02/2023  9:13 AM      Passed - TSH in normal range and within 360 days    TSH  Date Value Ref Range Status  07/10/2023 1.380 0.450 - 4.500 uIU/mL Final         Passed - Valid encounter within last 12 months    Recent Outpatient Visits           3 weeks ago Annual physical exam   [place] [facility] at [facility], Chales [place], MD       Future Appointments             In 3 months Gala Jubilee Chales [place], MD [facility] Primary Care & Sports Medicine at [facility]             metFORMIN  (GLUCOPHAGE ) 500 MG tablet [Pharmacy Med Name: METFORMIN  HCL TABS 500MG ] 180 tablet 3    Sig: TAKE 1 TABLET TWICE A DAY WITH MEALS     Endocrinology:  Diabetes - Biguanides Failed - 08/02/2023  9:13 AM      Failed - B12 Level in normal range and within 720 days    No results found for: "VITAMINB12"       Passed - Cr in normal range and within 360 days    Creatinine, Ser  Date Value Ref Range Status  07/10/2023 0.95 0.57 - 1.00 mg/dL Final         Passed - HBA1C is between 0 and 7.9 and within 180 days    Hemoglobin A1C  Date Value Ref Range Status  01/23/2020 6.4  Final   Hgb A1c MFr Bld  Date Value Ref Range Status  07/10/2023 6.4 (H) 4.8 - 5.6 % Final    Comment:             Prediabetes: 5.7 - 6.4          Diabetes: >6.4          Glycemic control for adults with diabetes: <7.0          Passed - eGFR in normal range and within 360 days    GFR calc Af Amer  Date Value Ref Range Status  01/23/2020 62  Final   GFR calc non Af Amer  Date Value Ref Range Status  01/23/2020 53  Final   eGFR  Date Value Ref Range Status  07/10/2023 63 >59 mL/min/1.73 Final         Passed - Valid  encounter within last 6 months    Recent Outpatient Visits           3 weeks ago Annual physical exam   [place] [facility] at [facility], Chales [place], MD       Future Appointments             In 3 months Sheron Dixons, MD [facility] Primary Care & Sports Medicine at [facility], [facility]            Passed - CBC within normal limits and completed in the last 12 months    WBC  Date Value Ref Range Status  07/10/2023 9.4 3.4 - 10.8 x10E3/uL Final   RBC  Date Value  Ref Range Status  07/10/2023 4.35 3.77 - 5.28 x10E6/uL Final  07/22/2019 4.67 3.87 - 5.11 Final   Hemoglobin  Date Value Ref Range Status  07/10/2023 13.3 11.1 - 15.9 g/dL Final   Hematocrit  Date Value Ref Range Status  07/10/2023 39.3 34.0 - 46.6 % Final   MCHC  Date Value Ref Range Status  07/10/2023 33.8 31.5 - 35.7 g/dL Final   [facility]  Date Value Ref Range Status  07/10/2023 30.6 26.6 - 33.0 pg Final   MCV  Date Value Ref Range Status  07/10/2023 90 79 - 97 fL Final   No results found for: "PLTCOUNTKUC", "LABPLAT", "POCPLA" RDW  Date Value Ref Range Status  07/10/2023 14.1 11.7 - 15.4 % Final

## 2023-09-01 ENCOUNTER — Other Ambulatory Visit: Payer: Self-pay | Admitting: Internal Medicine

## 2023-09-01 DIAGNOSIS — E790 Hyperuricemia without signs of inflammatory arthritis and tophaceous disease: Secondary | ICD-10-CM

## 2023-09-01 DIAGNOSIS — M170 Bilateral primary osteoarthritis of knee: Secondary | ICD-10-CM

## 2023-09-04 ENCOUNTER — Other Ambulatory Visit: Payer: Self-pay

## 2023-09-04 NOTE — Telephone Encounter (Signed)
 Requested medications are due for refill today.  yes  Requested medications are on the active medications list.  yes  Last refill. 09/06/2022 #90 3 rf  Future visit scheduled.   yes  Notes to clinic.  Labs are expired.    Requested Prescriptions  Pending Prescriptions Disp Refills   allopurinol  (ZYLOPRIM ) 100 MG tablet [Pharmacy Med Name: ALLOPURINOL  TABS 100MG ] 90 tablet 3    Sig: TAKE 1 TABLET DAILY     Endocrinology:  Gout Agents - allopurinol  Failed - 09/04/2023  4:05 PM      Failed - Uric Acid in normal range and within 360 days    Uric Acid  Date Value Ref Range Status  06/27/2022 5.3 3.1 - 7.9 mg/dL Final    Comment:               Therapeutic target for gout patients: <6.0         Passed - Cr in normal range and within 360 days    Creatinine, Ser  Date Value Ref Range Status  07/10/2023 0.95 0.57 - 1.00 mg/dL Final         Passed - Valid encounter within last 12 months    Recent Outpatient Visits           1 month ago Annual physical exam   [place] [facility] at [facility], Leita DEL, MD       Future Appointments             In 2 months Justus Leita DEL, MD [facility] Primary Care & [facility] Medicine at [facility], [facility]            Passed - CBC within normal limits and completed in the last 12 months    WBC  Date Value Ref Range Status  07/10/2023 9.4 3.4 - 10.8 x10E3/uL Final   RBC  Date Value Ref Range Status  07/10/2023 4.35 3.77 - 5.28 x10E6/uL Final  07/22/2019 4.67 3.87 - 5.11 Final   Hemoglobin  Date Value Ref Range Status  07/10/2023 13.3 11.1 - 15.9 g/dL Final   Hematocrit  Date Value Ref Range Status  07/10/2023 39.3 34.0 - 46.6 % Final   MCHC  Date Value Ref Range Status  07/10/2023 33.8 31.5 - 35.7 g/dL Final   [facility]  Date Value Ref Range Status  07/10/2023 30.6 26.6 - 33.0 pg Final   MCV  Date Value Ref Range Status  07/10/2023 90 79 - 97 fL Final   No results found for:  PLTCOUNTKUC, LABPLAT, POCPLA RDW  Date Value Ref Range Status  07/10/2023 14.1 11.7 - 15.4 % Final         Signed Prescriptions Disp Refills   celecoxib  (CELEBREX ) 200 MG capsule 90 capsule 3    Sig: TAKE 1 CAPSULE DAILY     Analgesics:  COX2 Inhibitors Failed - 09/04/2023  4:05 PM      Failed - Manual Review: Labs are only required if the patient has taken medication for more than 8 weeks.      Passed - HGB in normal range and within 360 days    Hemoglobin  Date Value Ref Range Status  07/10/2023 13.3 11.1 - 15.9 g/dL Final         Passed - Cr in normal range and within 360 days    Creatinine, Ser  Date Value Ref Range Status  07/10/2023 0.95 0.57 - 1.00 mg/dL Final  Passed - HCT in normal range and within 360 days    Hematocrit  Date Value Ref Range Status  07/10/2023 39.3 34.0 - 46.6 % Final         Passed - AST in normal range and within 360 days    AST  Date Value Ref Range Status  07/10/2023 18 0 - 40 IU/L Final         Passed - ALT in normal range and within 360 days    ALT  Date Value Ref Range Status  07/10/2023 15 0 - 32 IU/L Final         Passed - eGFR is 30 or above and within 360 days    GFR calc Af Amer  Date Value Ref Range Status  01/23/2020 62  Final   GFR calc non Af Amer  Date Value Ref Range Status  01/23/2020 53  Final   eGFR  Date Value Ref Range Status  07/10/2023 63 >59 mL/min/1.73 Final         Passed - Patient is not pregnant      Passed - Valid encounter within last 12 months    Recent Outpatient Visits           1 month ago Annual physical exam   [facility] at [facility], Leita DEL, MD       Future Appointments             In 2 months Justus, Leita DEL, MD [facility] Primary Care & Sports Medicine at [facility], [facility]

## 2023-09-04 NOTE — Telephone Encounter (Signed)
 Requested Prescriptions  Pending Prescriptions Disp Refills   allopurinol  (ZYLOPRIM ) 100 MG tablet [Pharmacy Med Name: ALLOPURINOL  TABS 100MG ] 90 tablet 3    Sig: TAKE 1 TABLET DAILY     Endocrinology:  Gout Agents - allopurinol  Failed - 09/04/2023  4:04 PM      Failed - Uric Acid in normal range and within 360 days    Uric Acid  Date Value Ref Range Status  06/27/2022 5.3 3.1 - 7.9 mg/dL Final    Comment:               Therapeutic target for gout patients: <6.0         Passed - Cr in normal range and within 360 days    Creatinine, Ser  Date Value Ref Range Status  07/10/2023 0.95 0.57 - 1.00 mg/dL Final         Passed - Valid encounter within last 12 months    Recent Outpatient Visits           1 month ago Annual physical exam   [place] [facility] at [facility], Leita DEL, MD       Future Appointments             In 2 months Justus Leita DEL, MD [facility] Primary Care & Sports Medicine at [facility], [facility]            Passed - CBC within normal limits and completed in the last 12 months    WBC  Date Value Ref Range Status  07/10/2023 9.4 3.4 - 10.8 x10E3/uL Final   RBC  Date Value Ref Range Status  07/10/2023 4.35 3.77 - 5.28 x10E6/uL Final  07/22/2019 4.67 3.87 - 5.11 Final   Hemoglobin  Date Value Ref Range Status  07/10/2023 13.3 11.1 - 15.9 g/dL Final   Hematocrit  Date Value Ref Range Status  07/10/2023 39.3 34.0 - 46.6 % Final   MCHC  Date Value Ref Range Status  07/10/2023 33.8 31.5 - 35.7 g/dL Final   [facility]  Date Value Ref Range Status  07/10/2023 30.6 26.6 - 33.0 pg Final   MCV  Date Value Ref Range Status  07/10/2023 90 79 - 97 fL Final   No results found for: PLTCOUNTKUC, LABPLAT, POCPLA RDW  Date Value Ref Range Status  07/10/2023 14.1 11.7 - 15.4 % Final          celecoxib  (CELEBREX ) 200 MG capsule [Pharmacy Med Name: CELECOXIB  CAPS 200MG ] 90 capsule 3    Sig: TAKE 1 CAPSULE DAILY      Analgesics:  COX2 Inhibitors Failed - 09/04/2023  4:04 PM      Failed - Manual Review: Labs are only required if the patient has taken medication for more than 8 weeks.      Passed - HGB in normal range and within 360 days    Hemoglobin  Date Value Ref Range Status  07/10/2023 13.3 11.1 - 15.9 g/dL Final         Passed - Cr in normal range and within 360 days    Creatinine, Ser  Date Value Ref Range Status  07/10/2023 0.95 0.57 - 1.00 mg/dL Final         Passed - HCT in normal range and within 360 days    Hematocrit  Date Value Ref Range Status  07/10/2023 39.3 34.0 - 46.6 % Final         Passed - AST in normal range  and within 360 days    AST  Date Value Ref Range Status  07/10/2023 18 0 - 40 IU/L Final         Passed - ALT in normal range and within 360 days    ALT  Date Value Ref Range Status  07/10/2023 15 0 - 32 IU/L Final         Passed - eGFR is 30 or above and within 360 days    GFR calc Af Amer  Date Value Ref Range Status  01/23/2020 62  Final   GFR calc non Af Amer  Date Value Ref Range Status  01/23/2020 53  Final   eGFR  Date Value Ref Range Status  07/10/2023 63 >59 mL/min/1.73 Final         Passed - Patient is not pregnant      Passed - Valid encounter within last 12 months    Recent Outpatient Visits           1 month ago Annual physical exam   [facility] Primary Care & [facility] Medicine at [facility], Leita DEL, MD       Future Appointments             In 2 months Justus, Leita DEL, MD [facility] Primary Care & Sports Medicine at [facility], [facility]

## 2023-09-07 ENCOUNTER — Other Ambulatory Visit: Payer: Self-pay | Admitting: Internal Medicine

## 2023-09-07 DIAGNOSIS — E1129 Type 2 diabetes mellitus with other diabetic kidney complication: Secondary | ICD-10-CM

## 2023-09-08 NOTE — Telephone Encounter (Signed)
 Requested Prescriptions  Pending Prescriptions Disp Refills   losartan  (COZAAR ) 25 MG tablet [Pharmacy Med Name: LOSARTAN  TABS 25MG ] 90 tablet 1    Sig: TAKE 1 TABLET DAILY     Cardiovascular:  Angiotensin Receptor Blockers Passed - 09/08/2023 11:46 AM      Passed - Cr in normal range and within 180 days    Creatinine, Ser  Date Value Ref Range Status  07/10/2023 0.95 0.57 - 1.00 mg/dL Final         Passed - K in normal range and within 180 days    Potassium  Date Value Ref Range Status  07/10/2023 4.3 3.5 - 5.2 mmol/L Final         Passed - Patient is not pregnant      Passed - Last BP in normal range    BP Readings from Last 1 Encounters:  07/10/23 128/76         Passed - Valid encounter within last 6 months    Recent Outpatient Visits           2 months ago Annual physical exam   [facility] Primary Care & Sports Medicine at [facility], Leita DEL, MD       Future Appointments             In 2 months Justus, Leita DEL, MD [facility] Health Primary Care & Sports Medicine at [facility], [facility]

## 2023-11-09 ENCOUNTER — Encounter: Payer: Self-pay | Admitting: Internal Medicine

## 2023-11-09 ENCOUNTER — Ambulatory Visit (INDEPENDENT_AMBULATORY_CARE_PROVIDER_SITE_OTHER): Admitting: Internal Medicine

## 2023-11-09 VITALS — BP 122/78 | HR 69 | Ht 62.0 in | Wt 202.0 lb

## 2023-11-09 DIAGNOSIS — Z8249 Family history of ischemic heart disease and other diseases of the circulatory system: Secondary | ICD-10-CM | POA: Diagnosis not present

## 2023-11-09 DIAGNOSIS — Z7984 Long term (current) use of oral hypoglycemic drugs: Secondary | ICD-10-CM

## 2023-11-09 DIAGNOSIS — E1122 Type 2 diabetes mellitus with diabetic chronic kidney disease: Secondary | ICD-10-CM | POA: Diagnosis not present

## 2023-11-09 DIAGNOSIS — E118 Type 2 diabetes mellitus with unspecified complications: Secondary | ICD-10-CM

## 2023-11-09 DIAGNOSIS — K219 Gastro-esophageal reflux disease without esophagitis: Secondary | ICD-10-CM | POA: Diagnosis not present

## 2023-11-09 DIAGNOSIS — I10 Essential (primary) hypertension: Secondary | ICD-10-CM | POA: Diagnosis not present

## 2023-11-09 DIAGNOSIS — E790 Hyperuricemia without signs of inflammatory arthritis and tophaceous disease: Secondary | ICD-10-CM

## 2023-11-09 DIAGNOSIS — E785 Hyperlipidemia, unspecified: Secondary | ICD-10-CM | POA: Diagnosis not present

## 2023-11-09 DIAGNOSIS — E1136 Type 2 diabetes mellitus with diabetic cataract: Secondary | ICD-10-CM | POA: Diagnosis not present

## 2023-11-09 DIAGNOSIS — M1990 Unspecified osteoarthritis, unspecified site: Secondary | ICD-10-CM | POA: Diagnosis not present

## 2023-11-09 DIAGNOSIS — E039 Hypothyroidism, unspecified: Secondary | ICD-10-CM | POA: Diagnosis not present

## 2023-11-09 DIAGNOSIS — E1165 Type 2 diabetes mellitus with hyperglycemia: Secondary | ICD-10-CM | POA: Diagnosis not present

## 2023-11-09 LAB — POCT GLYCOSYLATED HEMOGLOBIN (HGB A1C): Hemoglobin A1C: 5.8 % — AB (ref 4.0–5.6)

## 2023-11-09 MED ORDER — ALLOPURINOL 100 MG PO TABS: 100.0000 mg | ORAL_TABLET | Freq: Every day | ORAL | 1 refills | Status: AC

## 2023-11-09 NOTE — Assessment & Plan Note (Signed)
 Blood pressure is well controlled on losartan . No medication side effects noted. Plan to continue current medications.

## 2023-11-09 NOTE — Assessment & Plan Note (Addendum)
 Blood sugars have been stable.  No hypoglycemic events since last visit. Currently medications are MTF. Last visit medical regimen changes were none. Lab Results  Component Value Date   HGBA1C 6.4 (H) 07/10/2023   A1C today = 5.8.  she will continue the same medications and follow up 4-5 months and as needed.

## 2023-11-09 NOTE — Patient Instructions (Addendum)
 Follow up in January for new PCP and diabetes and hypertension. Dr. Harlene Saddler Dr. Vinay Kotturi

## 2023-11-09 NOTE — Progress Notes (Signed)
 Date:  11/09/2023   Name:  Alexandria Franklin   DOB:  11-07-1950   MRN:  [id number]   Chief Complaint: Diabetes and Hypertension  Hypertension This is a chronic problem. The problem is controlled. Pertinent negatives include no chest pain, headaches, palpitations or shortness of breath. Past treatments include angiotensin blockers. The current treatment provides significant improvement. There is no history of kidney disease, CAD/MI or CVA.  Diabetes She presents for her follow-up diabetic visit. She has type 2 diabetes mellitus. Pertinent negatives for hypoglycemia include no dizziness or headaches. Pertinent negatives for diabetes include no chest pain, no fatigue and no weakness. Pertinent negatives for diabetic complications include no CVA. Current diabetic treatment includes oral agent (monotherapy) (MTF).    Review of Systems  Constitutional:  Negative for fatigue and unexpected weight change.  HENT:  Negative for trouble swallowing.   Eyes:  Negative for visual disturbance.  Respiratory:  Negative for cough, chest tightness, shortness of breath and wheezing.   Cardiovascular:  Negative for chest pain, palpitations and leg swelling.  Gastrointestinal:  Negative for abdominal pain, constipation and diarrhea.  Musculoskeletal:  Negative for arthralgias and myalgias.  Neurological:  Negative for dizziness, weakness, light-headedness and headaches.     Lab Results  Component Value Date   NA 140 07/10/2023   K 4.3 07/10/2023   CO2 22 07/10/2023   GLUCOSE 99 07/10/2023   BUN 11 07/10/2023   CREATININE 0.95 07/10/2023   CALCIUM  9.4 07/10/2023   EGFR 63 07/10/2023   GFRNONAA 53 01/23/2020   Lab Results  Component Value Date   CHOL 144 07/10/2023   HDL 39 (L) 07/10/2023   LDLCALC 73 07/10/2023   TRIG 188 (H) 07/10/2023   CHOLHDL 3.7 07/10/2023   Lab Results  Component Value Date   TSH 1.380 07/10/2023   Lab Results  Component Value Date   HGBA1C 5.8 (A) 11/09/2023    Lab Results  Component Value Date   WBC 9.4 07/10/2023   HGB 13.3 07/10/2023   HCT 39.3 07/10/2023   MCV 90 07/10/2023   PLT 388 07/10/2023   Lab Results  Component Value Date   ALT 15 07/10/2023   AST 18 07/10/2023   ALKPHOS 104 07/10/2023   BILITOT 0.4 07/10/2023   No results found for: MARIEN BOLLS, VD25OH   Patient Active Problem List   Diagnosis Date Noted   Stage 3a chronic kidney disease (HCC) 10/25/2021   BMI 38.0-38.9,adult 06/09/2021   Chronic cough 06/09/2021   Hyperlipidemia associated with type 2 diabetes mellitus (HCC) 10/07/2020   Acquired hypothyroidism 09/30/2020   Asymptomatic hyperuricemia 09/30/2020   Obesity, morbid (HCC) 09/30/2020   Type II diabetes mellitus with complication (HCC) 09/23/2020   Essential hypertension 09/23/2020   GERD (gastroesophageal reflux disease) 09/23/2020   Osteoarthritis of both knees 09/23/2020    Allergies  Allergen Reactions   Lisinopril  Cough   Codeine Nausea Only    Past Surgical History:  Procedure Laterality Date   ABDOMINAL HYSTERECTOMY  1991   CHOLECYSTECTOMY     CHOLECYSTECTOMY, LAPAROSCOPIC  1980   PR LARYNGOSCOPY,DIRCT,OP SCOP,EXC TUMR  N/A 03/2023   TUBAL LIGATION      Social History   Tobacco Use   Smoking status: Never    Passive exposure: Never   Smokeless tobacco: Never  Vaping Use   Vaping status: Never Used  Substance Use Topics   Alcohol use: Not Currently   Drug use: Never     Medication list has been reviewed  and updated.  Current Meds  Medication Sig   allopurinol  (ZYLOPRIM ) 100 MG tablet Take 1 tablet (100 mg total) by mouth daily.   atorvastatin  (LIPITOR) 20 MG tablet TAKE 1 TABLET DAILY   azelastine (ASTELIN) 0.1 % nasal spray    celecoxib  (CELEBREX ) 200 MG capsule TAKE 1 CAPSULE DAILY   fluticasone  (FLONASE) 50 MCG/ACT nasal spray    levothyroxine  (SYNTHROID ) 50 MCG tablet TAKE 1 TABLET DAILY BEFORE BREAKFAST   losartan  (COZAAR ) 25 MG tablet TAKE 1 TABLET  DAILY   metFORMIN  (GLUCOPHAGE ) 500 MG tablet TAKE 1 TABLET TWICE A DAY WITH MEALS   Multiple Vitamin (MULTIVITAMIN ADULT PO) Take 1 capsule by mouth daily.   omeprazole  (PRILOSEC) 40 MG capsule TAKE 1 CAPSULE DAILY       11/09/2023   10:49 AM 07/10/2023    1:21 PM 10/20/2022    8:16 AM 06/27/2022    9:45 AM  GAD 7 : Generalized Anxiety Score  Nervous, Anxious, on Edge 1 0 0 0  Control/stop worrying 1 0 0 0  Worry too much - different things 1 0 0 0  Trouble relaxing 0 0 0 0  Restless 0 0 0 0  Easily annoyed or irritable 1 1 0 0  Afraid - awful might happen 0 0 0 0  Total GAD 7 Score 4 1 0 0  Anxiety Difficulty Not difficult at all Not difficult at all Not difficult at all Not difficult at all       11/09/2023   10:49 AM 07/10/2023    1:21 PM 06/15/2023    1:16 PM  Depression screen PHQ 2/9  Decreased Interest 0 0 0  Down, Depressed, Hopeless 0 0 0  PHQ - 2 Score 0 0 0  Altered sleeping 0 1 0  Tired, decreased energy 0 2 1  Change in appetite 0 0 0  Feeling bad or failure about yourself  0 0 0  Trouble concentrating 0 0 0  Moving slowly or fidgety/restless 0 0 0  Suicidal thoughts 0 0 0  PHQ-9 Score 0 3 1  Difficult doing work/chores Not difficult at all Not difficult at all Not difficult at all    BP Readings from Last 3 Encounters:  11/09/23 122/78  07/10/23 128/76  10/20/22 126/70    Physical Exam Vitals and nursing note reviewed.  Constitutional:      General: She is not in acute distress.    Appearance: Normal appearance. She is well-developed. She is obese.  HENT:     Head: Normocephalic and atraumatic.  Neck:     Vascular: No carotid bruit.  Cardiovascular:     Rate and Rhythm: Normal rate and regular rhythm.     Heart sounds: No murmur heard. Pulmonary:     Effort: Pulmonary effort is normal. No respiratory distress.     Breath sounds: No wheezing or rhonchi.  Musculoskeletal:     Cervical back: Normal range of motion.     Right lower leg: No edema.      Left lower leg: No edema.  Lymphadenopathy:     Cervical: No cervical adenopathy.  Skin:    General: Skin is warm and dry.     Findings: No rash.  Neurological:     General: No focal deficit present.     Mental Status: She is alert and oriented to person, place, and time.     Motor: No weakness.     Gait: Gait normal.  Psychiatric:  Mood and Affect: Mood normal.        Behavior: Behavior normal.     Wt Readings from Last 3 Encounters:  11/09/23 202 lb (91.6 kg)  07/10/23 202 lb (91.6 kg)  06/15/23 200 lb (90.7 kg)    BP 122/78   Pulse 69   Ht 5' 2 (1.575 m)   Wt 202 lb (91.6 kg)   SpO2 98%   BMI 36.95 kg/m   Assessment and Plan:  Problem List Items Addressed This Visit       Unprioritized   Type II diabetes mellitus with complication (HCC) (Chronic)   Blood sugars have been stable.  No hypoglycemic events since last visit. Currently medications are MTF. Last visit medical regimen changes were none. Lab Results  Component Value Date   HGBA1C 6.4 (H) 07/10/2023   A1C today = 5.8.  she will continue the same medications and follow up 4-5 months and as needed.       Relevant Orders   POCT glycosylated hemoglobin (Hb A1C) (Completed)   Essential hypertension - Primary (Chronic)   Blood pressure is well controlled on losartan . No medication side effects noted. Plan to continue current medications.       Asymptomatic hyperuricemia   Relevant Medications   allopurinol  (ZYLOPRIM ) 100 MG tablet   Other Visit Diagnoses       Long term current use of oral hypoglycemic drug           Return in about 18 weeks (around 03/14/2024), or with PCP of choice for DM, HTN.    Leita HILARIO Adie, MD [facility] [place]

## 2023-11-23 DIAGNOSIS — H04201 Unspecified epiphora, right lacrimal gland: Secondary | ICD-10-CM | POA: Diagnosis not present

## 2023-11-27 ENCOUNTER — Other Ambulatory Visit: Payer: Self-pay

## 2023-11-27 DIAGNOSIS — E1169 Type 2 diabetes mellitus with other specified complication: Secondary | ICD-10-CM

## 2023-11-27 MED ORDER — ATORVASTATIN CALCIUM 20 MG PO TABS: 20.0000 mg | ORAL_TABLET | Freq: Every day | ORAL | 1 refills | Status: AC

## 2023-11-28 DIAGNOSIS — J386 Stenosis of larynx: Secondary | ICD-10-CM | POA: Diagnosis not present

## 2023-12-10 ENCOUNTER — Ambulatory Visit: Admission: EM | Admit: 2023-12-10 | Discharge: 2023-12-10 | Disposition: A | Discharge status: Home / Self Care

## 2023-12-10 DIAGNOSIS — S8012XA Contusion of left lower leg, initial encounter: Secondary | ICD-10-CM | POA: Diagnosis not present

## 2023-12-10 DIAGNOSIS — S0990XA Unspecified injury of head, initial encounter: Secondary | ICD-10-CM | POA: Diagnosis not present

## 2023-12-10 DIAGNOSIS — S80812A Abrasion, left lower leg, initial encounter: Secondary | ICD-10-CM | POA: Diagnosis not present

## 2023-12-10 NOTE — Discharge Instructions (Signed)
-   Clean the abrasion with soap and water and apply topical Neosporin and cover with Band-Aid.  If you notice increased swelling, redness or pustular drainage return for reevaluation. - You have a hematoma or a deep bruise.  You may notice a little bit of increased swelling and bluish discoloration of skin.  Ice the area every couple of hours over the next 3 days and then switch to using heat if you still have swelling and discomfort. - You may take Tylenol as needed for pain relief. - You appear to have a minor head injury.  There is no visible head trauma and your neurological exam is normal. - However, if you start to experience very severe headaches, vision changes, dizziness, confusion, vomiting, difficulty speaking or walking, numbness/tingling or weakness please call 911 or have someone take you immediately to the ER for consideration of CT imaging of head to rule out intracranial bleed.  I have very very low suspicion for that at this time as symptoms appear to be minor.

## 2023-12-10 NOTE — ED Triage Notes (Signed)
 Pt c/o fall and injury to left leg and head  Pt states that she fell through the flooring of her house and has left leg pain and scratches.  Pt states that she hit her head along the railing when she fell and now has pain along the left side of her head  Pt denies a headache, vision changes, dizziness, nausea  Pt had a TDAP given in 04/05/22

## 2023-12-10 NOTE — ED Provider Notes (Signed)
 [facility]    CSN: [id number] Arrival date & time: 12/10/23  1541      History   Chief Complaint Chief Complaint  Patient presents with   Fall    HPI Alexandria Franklin is a 73 y.o. female with history of diabetes, hypertension, hypothyroidism, hyperlipidemia, stage III CKD, rheumatoid arthritis, neuromuscular disorder, GERD, bilateral knee osteoarthritis presents for accidental fall and subsequent head injury and left leg injury that occurred today. She reports that her left leg fell though her deck and she thinks she might have hit her head on the stair rail. No LOC. This occurred 1 hour ago.  She has a small abrasion/puncture wounds of the left medial lower leg with surrounding swelling and developing hematoma.  She has no pain in the shin bone or other aspects of the leg and is able to weight-bear without difficulty.  She says the area only hurts when it is touched.  She reports having tetanus in the past 1 to 2 years.    Patient does not report headaches, vision changes, confusion, difficulty with balance or speech, numbness, tingling, weakness of extremities, chest pain, shortness of breath, palpitations.  HPI  Past Medical History:  Diagnosis Date   Allergy    Diabetes (HCC)    Essential hypertension    GERD (gastroesophageal reflux disease)    Neuromuscular disorder (HCC)    Rheumatoid arthritis (HCC)    Thyroid disease     Patient Active Problem List   Diagnosis Date Noted   Stage 3a chronic kidney disease (HCC) 10/25/2021   BMI 38.0-38.9,adult 06/09/2021   Chronic cough 06/09/2021   Hyperlipidemia associated with type 2 diabetes mellitus (HCC) 10/07/2020   Acquired hypothyroidism 09/30/2020   Asymptomatic hyperuricemia 09/30/2020   Obesity, morbid (HCC) 09/30/2020   Type II diabetes mellitus with complication (HCC) 09/23/2020   Essential hypertension 09/23/2020   GERD (gastroesophageal reflux disease) 09/23/2020   Osteoarthritis of both knees  09/23/2020    Past Surgical History:  Procedure Laterality Date   ABDOMINAL HYSTERECTOMY  1991   CHOLECYSTECTOMY     CHOLECYSTECTOMY, LAPAROSCOPIC  1980   PR LARYNGOSCOPY,DIRCT,OP SCOP,EXC TUMR  N/A 03/2023   TUBAL LIGATION      OB History   No obstetric history on file.      Home Medications    Prior to Admission medications   Medication Sig Start Date End Date Taking? Authorizing Provider  allopurinol  (ZYLOPRIM ) 100 MG tablet Take 1 tablet (100 mg total) by mouth daily. 11/09/23   Justus Leita DEL, MD  atorvastatin  (LIPITOR) 20 MG tablet Take 1 tablet (20 mg total) by mouth daily. 11/27/23   Justus Leita DEL, MD  azelastine (ASTELIN) 0.1 % nasal spray  11/18/21   [provider]  celecoxib  (CELEBREX ) 200 MG capsule TAKE 1 CAPSULE DAILY 09/04/23   Berglund, Laura H, MD  fluticasone  [facility]) 50 MCG/ACT nasal spray  11/25/21   [provider]  levothyroxine  (SYNTHROID ) 50 MCG tablet TAKE 1 TABLET DAILY BEFORE BREAKFAST 08/02/23   Berglund, Laura H, MD  losartan  (COZAAR ) 25 MG tablet TAKE 1 TABLET DAILY 09/08/23   Berglund, Laura H, MD  metFORMIN  (GLUCOPHAGE ) 500 MG tablet TAKE 1 TABLET TWICE A DAY WITH MEALS 08/02/23   Berglund, Laura H, MD  Multiple Vitamin (MULTIVITAMIN ADULT PO) Take 1 capsule by mouth daily.    [provider]  omeprazole  (PRILOSEC) 40 MG capsule TAKE 1 CAPSULE DAILY 08/03/22   Justus Leita DEL, MD    Family History  Family History  Problem Relation Age of Onset   Cancer Mother    Miscarriages / Stillbirths Mother    Obesity Mother    Thyroid disease Father    Diabetes Father    Mental illness Sister    Breast cancer Neg Hx     Social History Social History   Tobacco Use   Smoking status: Never    Passive exposure: Never   Smokeless tobacco: Never  Vaping Use   Vaping status: Never Used  Substance Use Topics   Alcohol use: Not Currently   Drug use: Never     Allergies   Lisinopril  and Codeine   Review of  Systems Review of Systems  Constitutional:  Negative for fatigue.  Eyes:  Negative for visual disturbance.  Respiratory:  Negative for shortness of breath.   Cardiovascular:  Negative for chest pain and palpitations.  Gastrointestinal:  Negative for nausea and vomiting.  Musculoskeletal:  Positive for arthralgias and joint swelling. Negative for gait problem and neck pain.  Skin:  Positive for wound. Negative for color change.  Neurological:  Negative for dizziness, syncope, facial asymmetry, speech difficulty, weakness, light-headedness, numbness and headaches.     Physical Exam Triage Vital Signs ED Triage Vitals  Encounter Vitals Group     BP      Girls Systolic BP Percentile      Girls Diastolic BP Percentile      Boys Systolic BP Percentile      Boys Diastolic BP Percentile      Pulse      Resp      Temp      Temp src      SpO2      Weight      Height      Head Circumference      Peak Flow      Pain Score      Pain Loc      Pain Education      Exclude from Growth Chart    No data found.  Updated Vital Signs BP 135/79 (BP Location: Left Arm)   Pulse 70   Temp 98.2 F (36.8 C) (Oral)   Wt 204 lb (92.5 kg)   SpO2 96%   BMI 37.31 kg/m   Physical Exam Vitals and nursing note reviewed.  Constitutional:      General: She is not in acute distress.    Appearance: Normal appearance. She is not ill-appearing or toxic-appearing.  HENT:     Head: Normocephalic and atraumatic.     Comments: No visible head trauma    Right Ear: Tympanic membrane, ear canal and external ear normal.     Left Ear: Tympanic membrane, ear canal and external ear normal.     Nose: Nose normal.     Mouth/Throat:     Mouth: Mucous membranes are moist.     Pharynx: Oropharynx is clear.  Eyes:     General: No scleral icterus.       Right eye: No discharge.        Left eye: No discharge.     Extraocular Movements: Extraocular movements intact.     Conjunctiva/sclera: Conjunctivae normal.      Pupils: Pupils are equal, round, and reactive to light.  Cardiovascular:     Rate and Rhythm: Normal rate and regular rhythm.     Heart sounds: Normal heart sounds.  Pulmonary:     Effort: Pulmonary effort is normal. No respiratory distress.  Breath sounds: Normal breath sounds.  Musculoskeletal:     Cervical back: Neck supple.  Skin:    General: Skin is dry.     Comments: Small puncture/abrasion left medial calf with surrounding swelling and developing hematoma. There is no bony tenderness of left lower extremity. Normal gait.  Neurological:     General: No focal deficit present.     Mental Status: She is alert and oriented to person, place, and time. Mental status is at baseline.     Cranial Nerves: No cranial nerve deficit.     Motor: No weakness.     Coordination: Coordination normal.     Gait: Gait normal.  Psychiatric:        Mood and Affect: Mood normal.        Behavior: Behavior normal.      UC Treatments / Results  Labs (all labs ordered are listed, but only abnormal results are displayed) Labs Reviewed - No data to display  EKG   Radiology No results found.  Procedures Procedures (including critical care time)  Medications Ordered in UC Medications - No data to display  Initial Impression / Assessment and Plan / UC Course  I have reviewed the triage vital signs and the nursing notes.  Pertinent labs & imaging results that were available during my care of the patient were reviewed by me and considered in my medical decision making (see chart for details).   73 year old female with history of type 2 diabetes, hypertension, hypothyroidism, rheumatoid arthritis, neuromuscular disorder presents for head injury and left leg injury after accidental fall today.  Vitals are all stable and normal and the patient is overall well-appearing.  No acute distress.  No visible head trauma.  Normal neurological exam.  Chest is clear.  Heart regular rate and rhythm.   She does have a small puncture/abrasion of the left medial calf with surrounding swelling and developing hematoma but no bony tenderness and full range of motion of the leg with normal gait.  Minor head injury and mild traumatic hematoma of left lower leg.  Discussed wound care guidelines with patient for the abrasion and advised to return for any signs of infection.  Advised on cryotherapy and Tylenol as needed for discomfort.  We also discussed care of head injury.  Patient has no red flag signs or symptoms at this time.  She is also not on an anticoagulant.  However, I did advise her that if she develops any severe headaches, vision changes, dizziness, confusion, vomiting, difficulty with speech or balance, numbness/tingling or weakness she should call 911 or however someone take her to the ER as she might need a head CT.  Stable to monitor at this time.   Final Clinical Impressions(s) / UC Diagnoses   Final diagnoses:  Hematoma of left lower leg  Abrasion of left lower extremity, initial encounter  Minor head injury, initial encounter     Discharge Instructions      - Clean the abrasion with soap and water and apply topical Neosporin and cover with Band-Aid.  If you notice increased swelling, redness or pustular drainage return for reevaluation. - You have a hematoma or a deep bruise.  You may notice a little bit of increased swelling and bluish discoloration of skin.  Ice the area every couple of hours over the next 3 days and then switch to using heat if you still have swelling and discomfort. - You may take Tylenol as needed for pain relief. - You appear to  have a minor head injury.  There is no visible head trauma and your neurological exam is normal. - However, if you start to experience very severe headaches, vision changes, dizziness, confusion, vomiting, difficulty speaking or walking, numbness/tingling or weakness please call 911 or have someone take you immediately to the ER for  consideration of CT imaging of head to rule out intracranial bleed.  I have very very low suspicion for that at this time as symptoms appear to be minor.     ED Prescriptions   None    PDMP not reviewed this encounter.   Arvis Jolan NOVAK, PA-C 12/10/23 1601

## 2024-01-09 DIAGNOSIS — J386 Stenosis of larynx: Secondary | ICD-10-CM | POA: Diagnosis not present

## 2024-01-09 DIAGNOSIS — J310 Chronic rhinitis: Secondary | ICD-10-CM | POA: Diagnosis not present

## 2024-01-25 ENCOUNTER — Other Ambulatory Visit: Payer: Self-pay | Admitting: Internal Medicine

## 2024-01-25 DIAGNOSIS — E118 Type 2 diabetes mellitus with unspecified complications: Secondary | ICD-10-CM

## 2024-01-25 DIAGNOSIS — E039 Hypothyroidism, unspecified: Secondary | ICD-10-CM

## 2024-01-26 NOTE — Telephone Encounter (Signed)
 Requested Prescriptions  Pending Prescriptions Disp Refills   metFORMIN  (GLUCOPHAGE ) 500 MG tablet [Pharmacy Med Name: METFORMIN  HCL TABS 500MG ] 180 tablet 0    Sig: TAKE 1 TABLET TWICE A DAY WITH MEALS     Endocrinology:  Diabetes - Biguanides Failed - 01/26/2024  4:12 PM      Failed - B12 Level in normal range and within 720 days    No results found for: VITAMINB12       Passed - Cr in normal range and within 360 days    Creatinine, Ser  Date Value Ref Range Status  07/10/2023 0.95 0.57 - 1.00 mg/dL Final         Passed - HBA1C is between 0 and 7.9 and within 180 days    Hemoglobin A1C  Date Value Ref Range Status  11/09/2023 5.8 (A) 4.0 - 5.6 % Final  01/23/2020 6.4  Final   Hgb A1c MFr Bld  Date Value Ref Range Status  07/10/2023 6.4 (H) 4.8 - 5.6 % Final    Comment:             Prediabetes: 5.7 - 6.4          Diabetes: >6.4          Glycemic control for adults with diabetes: <7.0          Passed - eGFR in normal range and within 360 days    GFR calc Af Amer  Date Value Ref Range Status  01/23/2020 62  Final   GFR calc non Af Amer  Date Value Ref Range Status  01/23/2020 53  Final   eGFR  Date Value Ref Range Status  07/10/2023 63 >59 mL/min/1.73 Final         Passed - Valid encounter within last 6 months    Recent Outpatient Visits           2 months ago Essential hypertension   [place] [facility] at [facility], Leita DEL, MD   6 months ago Annual physical exam   [facility] at [facility], Leita DEL, MD              Passed - CBC within normal limits and completed in the last 12 months    WBC  Date Value Ref Range Status  07/10/2023 9.4 3.4 - 10.8 x10E3/uL Final   RBC  Date Value Ref Range Status  07/10/2023 4.35 3.77 - 5.28 x10E6/uL Final  07/22/2019 4.67 3.87 - 5.11 Final   Hemoglobin  Date Value Ref Range Status  07/10/2023 13.3 11.1 - 15.9 g/dL Final    Hematocrit  Date Value Ref Range Status  07/10/2023 39.3 34.0 - 46.6 % Final   MCHC  Date Value Ref Range Status  07/10/2023 33.8 31.5 - 35.7 g/dL Final   [facility]  Date Value Ref Range Status  07/10/2023 30.6 26.6 - 33.0 pg Final   MCV  Date Value Ref Range Status  07/10/2023 90 79 - 97 fL Final   No results found for: PLTCOUNTKUC, LABPLAT, POCPLA RDW  Date Value Ref Range Status  07/10/2023 14.1 11.7 - 15.4 % Final          levothyroxine  (SYNTHROID ) 50 MCG tablet [Pharmacy Med Name: L-THYROXINE TABS 50MCG] 90 tablet 0    Sig: TAKE 1 TABLET DAILY BEFORE BREAKFAST     Endocrinology:  Hypothyroid Agents Passed - 01/26/2024  4:12 PM  Passed - TSH in normal range and within 360 days    TSH  Date Value Ref Range Status  07/10/2023 1.380 0.450 - 4.500 uIU/mL Final         Passed - Valid encounter within last 12 months    Recent Outpatient Visits           2 months ago Essential hypertension   [place] [facility] at [facility], Leita DEL, MD   6 months ago Annual physical exam   [facility] at [facility], Leita DEL, MD

## 2024-03-05 ENCOUNTER — Other Ambulatory Visit: Payer: Self-pay | Admitting: Internal Medicine

## 2024-03-05 DIAGNOSIS — E1129 Type 2 diabetes mellitus with other diabetic kidney complication: Secondary | ICD-10-CM

## 2024-03-06 NOTE — Telephone Encounter (Signed)
 Requested Prescriptions  Pending Prescriptions Disp Refills   losartan  (COZAAR ) 25 MG tablet [Pharmacy Med Name: LOSARTAN  TABS 25MG ] 90 tablet 0    Sig: TAKE 1 TABLET DAILY     Cardiovascular:  Angiotensin Receptor Blockers Failed - 03/06/2024  2:26 PM      Failed - Cr in normal range and within 180 days    Creatinine, Ser  Date Value Ref Range Status  07/10/2023 0.95 0.57 - 1.00 mg/dL Final         Failed - K in normal range and within 180 days    Potassium  Date Value Ref Range Status  07/10/2023 4.3 3.5 - 5.2 mmol/L Final         Passed - Patient is not pregnant      Passed - Last BP in normal range    BP Readings from Last 1 Encounters:  12/10/23 135/79         Passed - Valid encounter within last 6 months    Recent Outpatient Visits           3 months ago Essential hypertension   [place] [facility] at [facility], Leita DEL, MD   8 months ago Annual physical exam   [facility] at [facility], Leita DEL, MD

## 2024-03-15 ENCOUNTER — Ambulatory Visit: Admitting: Student

## 2024-03-15 ENCOUNTER — Encounter: Payer: Self-pay | Admitting: Student

## 2024-03-15 VITALS — BP 126/74 | HR 67 | Ht 62.0 in | Wt 204.0 lb

## 2024-03-15 DIAGNOSIS — I10 Essential (primary) hypertension: Secondary | ICD-10-CM | POA: Diagnosis not present

## 2024-03-15 DIAGNOSIS — Z1231 Encounter for screening mammogram for malignant neoplasm of breast: Secondary | ICD-10-CM

## 2024-03-15 DIAGNOSIS — E118 Type 2 diabetes mellitus with unspecified complications: Secondary | ICD-10-CM | POA: Diagnosis not present

## 2024-03-15 DIAGNOSIS — K219 Gastro-esophageal reflux disease without esophagitis: Secondary | ICD-10-CM

## 2024-03-15 DIAGNOSIS — Z7984 Long term (current) use of oral hypoglycemic drugs: Secondary | ICD-10-CM

## 2024-03-15 DIAGNOSIS — E790 Hyperuricemia without signs of inflammatory arthritis and tophaceous disease: Secondary | ICD-10-CM

## 2024-03-15 DIAGNOSIS — N1831 Chronic kidney disease, stage 3a: Secondary | ICD-10-CM

## 2024-03-15 DIAGNOSIS — E039 Hypothyroidism, unspecified: Secondary | ICD-10-CM | POA: Diagnosis not present

## 2024-03-15 MED ORDER — OMEPRAZOLE 40 MG PO CPDR: 40.0000 mg | DELAYED_RELEASE_CAPSULE | Freq: Every day | ORAL | 3 refills | Status: AC

## 2024-03-15 NOTE — Assessment & Plan Note (Addendum)
 No hx of gout, recommend stopping allopurinol . Will contact me if she develops symptoms.

## 2024-03-15 NOTE — Assessment & Plan Note (Addendum)
 Well controlled on losartan  25 mg daily.  CMP today

## 2024-03-15 NOTE — Patient Instructions (Addendum)
 Please call to schedule your mammogram at [phone]   Please decrease uric acid to 50 mg daily, please call if you develop any symptoms of gout

## 2024-03-15 NOTE — Assessment & Plan Note (Addendum)
 Currently on metformin  500 mg twice daily without significant adverse effects. Well controlled. A1c at next visit.

## 2024-03-16 LAB — COMPREHENSIVE METABOLIC PANEL WITH GFR
ALT: 11 IU/L (ref 0–32)
AST: 13 IU/L (ref 0–40)
Albumin: 4.2 g/dL (ref 3.8–4.8)
Alkaline Phosphatase: 99 IU/L (ref 49–135)
BUN/Creatinine Ratio: 13 (ref 12–28)
BUN: 13 mg/dL (ref 8–27)
Bilirubin Total: 0.5 mg/dL (ref 0.0–1.2)
CO2: 19 mmol/L — ABNORMAL LOW (ref 20–29)
Calcium: 9.9 mg/dL (ref 8.7–10.3)
Chloride: 107 mmol/L — ABNORMAL HIGH (ref 96–106)
Creatinine, Ser: 1.04 mg/dL — ABNORMAL HIGH (ref 0.57–1.00)
Globulin, Total: 2.6 g/dL (ref 1.5–4.5)
Glucose: 108 mg/dL — ABNORMAL HIGH (ref 70–99)
Potassium: 4.8 mmol/L (ref 3.5–5.2)
Sodium: 142 mmol/L (ref 134–144)
Total Protein: 6.8 g/dL (ref 6.0–8.5)
eGFR: 56 mL/min/1.73 — ABNORMAL LOW

## 2024-03-18 ENCOUNTER — Ambulatory Visit: Payer: Self-pay | Admitting: Student

## 2024-03-20 NOTE — Assessment & Plan Note (Signed)
 Normal TSH on 07/10/2023. Continue Levothyroxine  50 mcg daily

## 2024-03-20 NOTE — Progress Notes (Signed)
 "  Established Patient Office Visit  Subjective   Patient ID: Alexandria Franklin, female    DOB: 09-Feb-1951  Age: 74 y.o. MRN: [id number]  Chief Complaint  Patient presents with   Establish Care   Diabetes    Alexandria Franklin is a 74 y.o. person with medical hx listed below who presents today for diabetes follow up. Previously seeing Dr. Justus who recently retired.  Patient Active Problem List   Diagnosis Date Noted   Stage 3a chronic kidney disease (HCC) 10/25/2021   BMI 38.0-38.9,adult 06/09/2021   Chronic cough 06/09/2021   Hyperlipidemia associated with type 2 diabetes mellitus (HCC) 10/07/2020   Acquired hypothyroidism 09/30/2020   Asymptomatic hyperuricemia 09/30/2020   Obesity, morbid (HCC) 09/30/2020   Type II diabetes mellitus with complication (HCC) 09/23/2020   Essential hypertension 09/23/2020   GERD (gastroesophageal reflux disease) 09/23/2020   Osteoarthritis of both knees 09/23/2020      ROS Refer to HPI    Objective:     Outpatient Encounter Medications as of 03/15/2024  Medication Sig   allopurinol  (ZYLOPRIM ) 100 MG tablet Take 1 tablet (100 mg total) by mouth daily.   atorvastatin  (LIPITOR) 20 MG tablet Take 1 tablet (20 mg total) by mouth daily.   azelastine (ASTELIN) 0.1 % nasal spray    celecoxib  (CELEBREX ) 200 MG capsule TAKE 1 CAPSULE DAILY   fluticasone  (FLONASE) 50 MCG/ACT nasal spray    levothyroxine  (SYNTHROID ) 50 MCG tablet TAKE 1 TABLET DAILY BEFORE BREAKFAST   losartan  (COZAAR ) 25 MG tablet TAKE 1 TABLET DAILY   metFORMIN  (GLUCOPHAGE ) 500 MG tablet TAKE 1 TABLET TWICE A DAY WITH MEALS   Multiple Vitamin (MULTIVITAMIN ADULT PO) Take 1 capsule by mouth daily.   [DISCONTINUED] omeprazole  (PRILOSEC) 40 MG capsule TAKE 1 CAPSULE DAILY   omeprazole  (PRILOSEC) 40 MG capsule Take 1 capsule (40 mg total) by mouth daily.   No facility-administered encounter medications on file as of 03/15/2024.    BP 126/74   Pulse 67   Ht 5' 2 (1.575 m)   Wt  204 lb (92.5 kg)   SpO2 98%   BMI 37.31 kg/m  BP Readings from Last 3 Encounters:  03/15/24 126/74  12/10/23 135/79  11/09/23 122/78    Physical Exam Constitutional:      Appearance: Normal appearance.  HENT:     Head: Normocephalic and atraumatic.  Cardiovascular:     Rate and Rhythm: Normal rate and regular rhythm.  Pulmonary:     Effort: Pulmonary effort is normal.     Breath sounds: No rhonchi or rales.  Abdominal:     General: Abdomen is flat. Bowel sounds are normal. There is no distension.     Palpations: Abdomen is soft.     Tenderness: There is no abdominal tenderness.  Musculoskeletal:        General: Normal range of motion.     Right lower leg: No edema.     Left lower leg: No edema.  Skin:    General: Skin is warm and dry.     Capillary Refill: Capillary refill takes less than 2 seconds.  Neurological:     General: No focal deficit present.     Mental Status: She is alert and oriented to person, place, and time.  Psychiatric:        Mood and Affect: Mood normal.        Behavior: Behavior normal.        11/09/2023   10:49 AM 07/10/2023  1:21 PM 06/15/2023    1:16 PM  Depression screen PHQ 2/9  Decreased Interest 0 0 0  Down, Depressed, Hopeless 0 0 0  PHQ - 2 Score 0 0 0  Altered sleeping 0 1 0  Tired, decreased energy 0 2 1  Change in appetite 0 0 0  Feeling bad or failure about yourself  0 0 0  Trouble concentrating 0 0 0  Moving slowly or fidgety/restless 0 0 0  Suicidal thoughts 0 0 0  PHQ-9 Score 0  3  1   Difficult doing work/chores Not difficult at all Not difficult at all Not difficult at all     Data saved with a previous flowsheet row definition       11/09/2023   10:49 AM 07/10/2023    1:21 PM 10/20/2022    8:16 AM 06/27/2022    9:45 AM  GAD 7 : Generalized Anxiety Score  Nervous, Anxious, on Edge 1 0 0 0  Control/stop worrying 1 0 0 0  Worry too much - different things 1 0 0 0  Trouble relaxing 0 0 0 0  Restless 0 0 0 0  Easily  annoyed or irritable 1 1 0 0  Afraid - awful might happen 0 0 0 0  Total GAD 7 Score 4 1 0 0  Anxiety Difficulty Not difficult at all Not difficult at all Not difficult at all Not difficult at all    Results for orders placed or performed in visit on 03/15/24  Comprehensive Metabolic Panel (CMET)  Result Value Ref Range   Glucose 108 (H) 70 - 99 mg/dL   BUN 13 8 - 27 mg/dL   Creatinine, Ser 8.95 (H) 0.57 - 1.00 mg/dL   eGFR 56 (L) >40 fO/fpw/8.26   BUN/Creatinine Ratio 13 12 - 28   Sodium 142 134 - 144 mmol/L   Potassium 4.8 3.5 - 5.2 mmol/L   Chloride 107 (H) 96 - 106 mmol/L   CO2 19 (L) 20 - 29 mmol/L   Calcium  9.9 8.7 - 10.3 mg/dL   Total Protein 6.8 6.0 - 8.5 g/dL   Albumin 4.2 3.8 - 4.8 g/dL   Globulin, Total 2.6 1.5 - 4.5 g/dL   Bilirubin Total 0.5 0.0 - 1.2 mg/dL   Alkaline Phosphatase 99 49 - 135 IU/L   AST 13 0 - 40 IU/L   ALT 11 0 - 32 IU/L      The 10-year ASCVD risk score (Arnett DK, et al., 2019) is: 34.8%    Assessment & Plan:  Type II diabetes mellitus with complication (HCC) Assessment & Plan: Currently on metformin  500 mg twice daily without significant adverse effects. Well controlled. A1c at next visit.    Gastroesophageal reflux disease, unspecified whether esophagitis present Assessment & Plan: Well controlled, continue omeprazole   Orders: -     Omeprazole ; Take 1 capsule (40 mg total) by mouth daily.  Dispense: 90 capsule; Refill: 3  Screening mammogram for breast cancer -     3D Screening Mammogram, Left and Right  Essential hypertension Assessment & Plan: Well controlled on losartan  25 mg daily.  CMP today  Orders: -     Comprehensive metabolic panel with GFR  Asymptomatic hyperuricemia Assessment & Plan: No hx of gout, recommend stopping allopurinol . Will contact me if she develops symptoms.    Long term current use of oral hypoglycemic drug  Stage 3a chronic kidney disease (HCC) Assessment & Plan: Baseline GFR in mid 50s. CMP  today  Acquired hypothyroidism Assessment & Plan: Normal TSH on 07/10/2023. Continue Levothyroxine  50 mcg daily      No follow-ups on file.    Harlene Saddler, MD "

## 2024-03-20 NOTE — Assessment & Plan Note (Signed)
Well controlled, continue omeprazole.  

## 2024-03-20 NOTE — Assessment & Plan Note (Signed)
 Baseline GFR in mid 50s. CMP today

## 2024-04-15 ENCOUNTER — Ambulatory Visit

## 2024-04-22 ENCOUNTER — Ambulatory Visit

## 2024-06-27 ENCOUNTER — Ambulatory Visit

## 2024-07-12 ENCOUNTER — Encounter: Admitting: Student
